# Patient Record
Sex: Male | Born: 1935 | Race: White | Hispanic: No | Marital: Married | Smoking: Smoker, current status unknown
Health system: Southern US, Community
[De-identification: ages and names within clinical notes are randomized; demographics above are authoritative.]

## PROBLEM LIST (undated history)

## (undated) DIAGNOSIS — Z960 Presence of urogenital implants: Secondary | ICD-10-CM

## (undated) DIAGNOSIS — I4891 Unspecified atrial fibrillation: Secondary | ICD-10-CM

## (undated) DIAGNOSIS — N390 Urinary tract infection, site not specified: Secondary | ICD-10-CM

## (undated) DIAGNOSIS — I10 Essential (primary) hypertension: Secondary | ICD-10-CM

## (undated) DIAGNOSIS — Z978 Presence of other specified devices: Secondary | ICD-10-CM

## (undated) DIAGNOSIS — M1990 Unspecified osteoarthritis, unspecified site: Secondary | ICD-10-CM

## (undated) DIAGNOSIS — F0390 Unspecified dementia without behavioral disturbance: Secondary | ICD-10-CM

## (undated) DIAGNOSIS — H269 Unspecified cataract: Secondary | ICD-10-CM

## (undated) HISTORY — PX: CHOLECYSTECTOMY: SHX55

---

## 2003-12-29 ENCOUNTER — Inpatient Hospital Stay: Payer: Self-pay | Admitting: Internal Medicine

## 2003-12-29 ENCOUNTER — Other Ambulatory Visit: Payer: Self-pay

## 2004-03-21 ENCOUNTER — Other Ambulatory Visit: Payer: Self-pay

## 2004-03-21 ENCOUNTER — Inpatient Hospital Stay: Payer: Self-pay | Admitting: Internal Medicine

## 2004-04-26 ENCOUNTER — Encounter: Payer: Self-pay | Admitting: Cardiology

## 2004-05-23 ENCOUNTER — Encounter: Payer: Self-pay | Admitting: Cardiology

## 2004-06-23 ENCOUNTER — Encounter: Payer: Self-pay | Admitting: Cardiology

## 2004-07-23 ENCOUNTER — Encounter: Payer: Self-pay | Admitting: Cardiology

## 2005-09-14 ENCOUNTER — Inpatient Hospital Stay: Payer: Self-pay | Admitting: Internal Medicine

## 2006-02-02 ENCOUNTER — Ambulatory Visit: Payer: Self-pay | Admitting: Cardiology

## 2006-05-05 ENCOUNTER — Inpatient Hospital Stay: Payer: Self-pay | Admitting: Internal Medicine

## 2006-05-05 ENCOUNTER — Other Ambulatory Visit: Payer: Self-pay

## 2006-06-19 ENCOUNTER — Ambulatory Visit: Payer: Self-pay | Admitting: Gastroenterology

## 2007-04-23 ENCOUNTER — Ambulatory Visit: Payer: Self-pay | Admitting: Internal Medicine

## 2007-07-23 ENCOUNTER — Ambulatory Visit: Payer: Self-pay | Admitting: Cardiology

## 2008-08-04 ENCOUNTER — Ambulatory Visit: Payer: Self-pay | Admitting: Ophthalmology

## 2008-08-10 ENCOUNTER — Ambulatory Visit: Payer: Self-pay | Admitting: Ophthalmology

## 2009-08-04 ENCOUNTER — Ambulatory Visit: Payer: Self-pay | Admitting: Ophthalmology

## 2009-08-16 ENCOUNTER — Ambulatory Visit: Payer: Self-pay | Admitting: Ophthalmology

## 2010-06-28 ENCOUNTER — Ambulatory Visit: Payer: Self-pay | Admitting: Gastroenterology

## 2011-10-23 ENCOUNTER — Ambulatory Visit: Payer: Self-pay | Admitting: Internal Medicine

## 2011-10-23 LAB — CREATININE, SERUM
Creatinine: 1.05 mg/dL (ref 0.60–1.30)
EGFR (Non-African Amer.): >60

## 2011-10-25 ENCOUNTER — Emergency Department: Payer: Self-pay | Admitting: Emergency Medicine

## 2011-10-25 LAB — URINALYSIS, COMPLETE
Bilirubin,UR: NEGATIVE
Glucose,UR: NEGATIVE mg/dL (ref 0–75)
Ketone: NEGATIVE
Leukocyte Esterase: NEGATIVE
Nitrite: NEGATIVE
Ph: 6 (ref 4.5–8.0)
Protein: NEGATIVE
RBC,UR: 80 /HPF (ref 0–5)
Specific Gravity: 1.017 (ref 1.003–1.030)

## 2011-10-25 LAB — CBC
HCT: 41.6 % (ref 40.0–52.0)
HGB: 14 g/dL (ref 13.0–18.0)
MCHC: 33.7 g/dL (ref 32.0–36.0)
MCV: 93 fL (ref 80–100)
Platelet: 129 10*3/uL — ABNORMAL LOW (ref 150–440)
RDW: 14.2 % (ref 11.5–14.5)
WBC: 11.4 10*3/uL — ABNORMAL HIGH (ref 3.8–10.6)

## 2011-10-25 LAB — COMPREHENSIVE METABOLIC PANEL
Alkaline Phosphatase: 78 U/L (ref 50–136)
Anion Gap: 8 (ref 7–16)
Calcium, Total: 8.2 mg/dL — ABNORMAL LOW (ref 8.5–10.1)
Chloride: 97 mmol/L — ABNORMAL LOW (ref 98–107)
Co2: 28 mmol/L (ref 21–32)
EGFR (African American): 29 — ABNORMAL LOW
EGFR (Non-African Amer.): 25 — ABNORMAL LOW
Osmolality: 271 (ref 275–301)
SGPT (ALT): 24 U/L (ref 12–78)
Sodium: 133 mmol/L — ABNORMAL LOW (ref 136–145)
Total Protein: 6.4 g/dL (ref 6.4–8.2)

## 2011-10-25 LAB — LIPASE, BLOOD: Lipase: 92 U/L (ref 73–393)

## 2011-10-27 ENCOUNTER — Emergency Department: Payer: Self-pay | Admitting: Emergency Medicine

## 2011-10-27 LAB — COMPREHENSIVE METABOLIC PANEL
Alkaline Phosphatase: 80 U/L (ref 50–136)
BUN: 18 mg/dL (ref 7–18)
Bilirubin,Total: 0.5 mg/dL (ref 0.2–1.0)
Chloride: 105 mmol/L (ref 98–107)
Co2: 29 mmol/L (ref 21–32)
Creatinine: 1.19 mg/dL (ref 0.60–1.30)
EGFR (Non-African Amer.): 53 — ABNORMAL LOW
Glucose: 89 mg/dL (ref 65–99)
Osmolality: 284 (ref 275–301)
SGOT(AST): 34 U/L (ref 15–37)
SGPT (ALT): 28 U/L (ref 12–78)
Sodium: 142 mmol/L (ref 136–145)
Total Protein: 6.3 g/dL — ABNORMAL LOW (ref 6.4–8.2)

## 2011-10-27 LAB — CBC
HCT: 39.8 % — ABNORMAL LOW (ref 40.0–52.0)
HGB: 13.6 g/dL (ref 13.0–18.0)
MCH: 31.9 pg (ref 26.0–34.0)
MCHC: 34.2 g/dL (ref 32.0–36.0)
MCV: 94 fL (ref 80–100)
RBC: 4.26 10*6/uL — ABNORMAL LOW (ref 4.40–5.90)

## 2011-10-27 LAB — URINALYSIS, COMPLETE
Bilirubin,UR: NEGATIVE
Glucose,UR: NEGATIVE mg/dL (ref 0–75)
Ketone: NEGATIVE
Leukocyte Esterase: NEGATIVE
Nitrite: NEGATIVE
Ph: 7 (ref 4.5–8.0)
RBC,UR: 25030 /HPF (ref 0–5)
Squamous Epithelial: NONE SEEN
WBC UR: 124 /HPF (ref 0–5)

## 2011-10-27 LAB — PROTIME-INR: INR: 2.8

## 2011-10-27 LAB — APTT: Activated PTT: 43.9 secs — ABNORMAL HIGH (ref 23.6–35.9)

## 2013-06-15 ENCOUNTER — Emergency Department: Payer: Self-pay | Admitting: Emergency Medicine

## 2013-06-15 LAB — CBC WITH DIFFERENTIAL/PLATELET
BASOS PCT: 0.7 %
Basophil #: 0 10*3/uL (ref 0.0–0.1)
EOS PCT: 1.3 %
Eosinophil #: 0.1 10*3/uL (ref 0.0–0.7)
HCT: 42 % (ref 40.0–52.0)
HGB: 13.8 g/dL (ref 13.0–18.0)
LYMPHS PCT: 23.8 %
Lymphocyte #: 1.1 10*3/uL (ref 1.0–3.6)
MCH: 30.8 pg (ref 26.0–34.0)
MCHC: 32.9 g/dL (ref 32.0–36.0)
MCV: 94 fL (ref 80–100)
MONO ABS: 0.5 x10 3/mm (ref 0.2–1.0)
Monocyte %: 9.5 %
NEUTROS PCT: 64.7 %
Neutrophil #: 3.1 10*3/uL (ref 1.4–6.5)
Platelet: 120 10*3/uL — ABNORMAL LOW (ref 150–440)
RBC: 4.48 10*6/uL (ref 4.40–5.90)
RDW: 15.6 % — AB (ref 11.5–14.5)
WBC: 4.7 10*3/uL (ref 3.8–10.6)

## 2013-06-15 LAB — TSH: THYROID STIMULATING HORM: 2.11 u[IU]/mL

## 2013-06-15 LAB — COMPREHENSIVE METABOLIC PANEL
ALK PHOS: 70 U/L
Albumin: 3.2 g/dL — ABNORMAL LOW (ref 3.4–5.0)
Anion Gap: 6 — ABNORMAL LOW (ref 7–16)
BUN: 18 mg/dL (ref 7–18)
Bilirubin,Total: 0.9 mg/dL (ref 0.2–1.0)
CALCIUM: 8.3 mg/dL — AB (ref 8.5–10.1)
Chloride: 105 mmol/L (ref 98–107)
Co2: 29 mmol/L (ref 21–32)
Creatinine: 0.96 mg/dL (ref 0.60–1.30)
GLUCOSE: 99 mg/dL (ref 65–99)
Osmolality: 281 (ref 275–301)
Potassium: 3.9 mmol/L (ref 3.5–5.1)
SGOT(AST): 18 U/L (ref 15–37)
SGPT (ALT): 17 U/L (ref 12–78)
SODIUM: 140 mmol/L (ref 136–145)
Total Protein: 6 g/dL — ABNORMAL LOW (ref 6.4–8.2)

## 2013-06-15 LAB — URINALYSIS, COMPLETE
Bilirubin,UR: NEGATIVE
Glucose,UR: NEGATIVE mg/dL (ref 0–75)
Ketone: NEGATIVE
Nitrite: NEGATIVE
PH: 5 (ref 4.5–8.0)
RBC,UR: 58 /HPF (ref 0–5)
Specific Gravity: 1.023 (ref 1.003–1.030)
Squamous Epithelial: NONE SEEN
WBC UR: 1758 /HPF (ref 0–5)

## 2013-06-15 LAB — PROTIME-INR
INR: 1.7
PROTHROMBIN TIME: 19.6 s — AB (ref 11.5–14.7)

## 2013-06-15 LAB — LIPASE, BLOOD: Lipase: 100 U/L (ref 73–393)

## 2013-06-15 LAB — DIGOXIN LEVEL: Digoxin: 1.13 ng/mL

## 2013-06-15 LAB — TROPONIN I: Troponin-I: <0.02 ng/mL

## 2013-06-18 LAB — URINE CULTURE

## 2013-06-20 LAB — CULTURE, BLOOD (SINGLE)

## 2013-06-30 ENCOUNTER — Ambulatory Visit: Payer: Self-pay | Admitting: Internal Medicine

## 2014-01-24 ENCOUNTER — Emergency Department: Payer: Self-pay | Admitting: Emergency Medicine

## 2014-02-14 ENCOUNTER — Emergency Department: Payer: Self-pay | Admitting: Emergency Medicine

## 2014-02-14 LAB — COMPREHENSIVE METABOLIC PANEL
ALBUMIN: 3.2 g/dL — AB (ref 3.4–5.0)
ALT: 19 U/L
Alkaline Phosphatase: 72 U/L
Anion Gap: 7 (ref 7–16)
BUN: 12 mg/dL (ref 7–18)
Bilirubin,Total: 0.8 mg/dL (ref 0.2–1.0)
Calcium, Total: 8.4 mg/dL — ABNORMAL LOW (ref 8.5–10.1)
Chloride: 105 mmol/L (ref 98–107)
Co2: 29 mmol/L (ref 21–32)
Creatinine: 0.88 mg/dL (ref 0.60–1.30)
EGFR (African American): >60
EGFR (Non-African Amer.): >60
Glucose: 89 mg/dL (ref 65–99)
Osmolality: 280 (ref 275–301)
POTASSIUM: 4.3 mmol/L (ref 3.5–5.1)
SGOT(AST): 29 U/L (ref 15–37)
SODIUM: 141 mmol/L (ref 136–145)
Total Protein: 6.5 g/dL (ref 6.4–8.2)

## 2014-02-14 LAB — CBC
HCT: 45.6 % (ref 40.0–52.0)
HGB: 14.6 g/dL (ref 13.0–18.0)
MCH: 30.6 pg (ref 26.0–34.0)
MCHC: 32.1 g/dL (ref 32.0–36.0)
MCV: 95 fL (ref 80–100)
Platelet: 128 10*3/uL — ABNORMAL LOW (ref 150–440)
RBC: 4.78 10*6/uL (ref 4.40–5.90)
RDW: 14.7 % — AB (ref 11.5–14.5)
WBC: 4.6 10*3/uL (ref 3.8–10.6)

## 2014-02-14 LAB — URINALYSIS, COMPLETE
Bilirubin,UR: NEGATIVE
Glucose,UR: NEGATIVE mg/dL (ref 0–75)
KETONE: NEGATIVE
Nitrite: NEGATIVE
PH: 6 (ref 4.5–8.0)
PROTEIN: NEGATIVE
RBC,UR: 2 /HPF (ref 0–5)
Specific Gravity: 1.005 (ref 1.003–1.030)
Squamous Epithelial: NONE SEEN
WBC UR: 14 /HPF (ref 0–5)

## 2014-02-14 LAB — TROPONIN I: Troponin-I: <0.02 ng/mL

## 2014-02-14 LAB — PROTIME-INR
INR: 1.6
PROTHROMBIN TIME: 18.8 s — AB (ref 11.5–14.7)

## 2014-02-14 LAB — DIGOXIN LEVEL: Digoxin: 1.55 ng/mL

## 2014-02-15 ENCOUNTER — Emergency Department: Payer: Self-pay | Admitting: Emergency Medicine

## 2014-05-02 ENCOUNTER — Emergency Department
Admit: 2014-05-02 | Disposition: A | Discharge status: Home / Self Care | Payer: Self-pay | Admitting: Emergency Medicine

## 2014-05-02 LAB — COMPREHENSIVE METABOLIC PANEL
ALK PHOS: 57 U/L
ANION GAP: 3 — AB (ref 7–16)
Albumin: 3.2 g/dL — ABNORMAL LOW
BILIRUBIN TOTAL: 0.8 mg/dL
BUN: 18 mg/dL
CHLORIDE: 107 mmol/L
CO2: 31 mmol/L
CREATININE: 0.99 mg/dL
Calcium, Total: 8.5 mg/dL — ABNORMAL LOW
Glucose: 100 mg/dL — ABNORMAL HIGH
POTASSIUM: 4.1 mmol/L
SGOT(AST): 18 U/L
SGPT (ALT): 15 U/L — ABNORMAL LOW
SODIUM: 141 mmol/L
Total Protein: 5.8 g/dL — ABNORMAL LOW

## 2014-05-02 LAB — URINALYSIS, COMPLETE
Bilirubin,UR: NEGATIVE
GLUCOSE, UR: NEGATIVE mg/dL (ref 0–75)
Ketone: NEGATIVE
Nitrite: NEGATIVE
Ph: 7 (ref 4.5–8.0)
Protein: NEGATIVE
SPECIFIC GRAVITY: 1.005 (ref 1.003–1.030)

## 2014-05-02 LAB — CBC
HCT: 38 % — ABNORMAL LOW (ref 40.0–52.0)
HGB: 12.3 g/dL — AB (ref 13.0–18.0)
MCH: 30.4 pg (ref 26.0–34.0)
MCHC: 32.5 g/dL (ref 32.0–36.0)
MCV: 94 fL (ref 80–100)
Platelet: 108 10*3/uL — ABNORMAL LOW (ref 150–440)
RBC: 4.05 10*6/uL — ABNORMAL LOW (ref 4.40–5.90)
RDW: 15.2 % — ABNORMAL HIGH (ref 11.5–14.5)
WBC: 4.7 10*3/uL (ref 3.8–10.6)

## 2014-05-02 LAB — DRUG SCREEN, URINE
Amphetamines, Ur Screen: NEGATIVE
BENZODIAZEPINE, UR SCRN: NEGATIVE
Barbiturates, Ur Screen: NEGATIVE
COCAINE METABOLITE, UR ~~LOC~~: NEGATIVE
Cannabinoid 50 Ng, Ur ~~LOC~~: NEGATIVE
MDMA (Ecstasy)Ur Screen: NEGATIVE
METHADONE, UR SCREEN: NEGATIVE
Opiate, Ur Screen: NEGATIVE
PHENCYCLIDINE (PCP) UR S: NEGATIVE
Tricyclic, Ur Screen: NEGATIVE

## 2014-05-02 LAB — SALICYLATE LEVEL

## 2014-05-02 LAB — ACETAMINOPHEN LEVEL

## 2014-05-02 LAB — TSH: Thyroid Stimulating Horm: 1.874 u[IU]/mL

## 2014-05-02 LAB — ETHANOL: Ethanol: <5 mg/dL

## 2014-05-05 LAB — URINE CULTURE

## 2014-05-16 ENCOUNTER — Emergency Department: Admit: 2014-05-16 | Disposition: A | Discharge status: Home / Self Care | Payer: Self-pay | Admitting: Student

## 2014-05-16 LAB — COMPREHENSIVE METABOLIC PANEL WITH GFR
Albumin: 3.9 g/dL
Alkaline Phosphatase: 70 U/L
Anion Gap: 9
BUN: 20 mg/dL
Bilirubin,Total: 1.5 mg/dL — ABNORMAL HIGH
Calcium, Total: 8.9 mg/dL
Chloride: 104 mmol/L
Co2: 29 mmol/L
Creatinine: 1.18 mg/dL
EGFR (African American): >60
EGFR (Non-African Amer.): 53 — ABNORMAL LOW
Glucose: 125 mg/dL — ABNORMAL HIGH
Potassium: 4.1 mmol/L
SGOT(AST): 27 U/L
SGPT (ALT): 17 U/L
Sodium: 142 mmol/L
Total Protein: 6.5 g/dL

## 2014-05-16 LAB — PROTIME-INR
INR: 1.8
Prothrombin Time: 21.3 secs — ABNORMAL HIGH

## 2014-05-16 LAB — URINALYSIS, COMPLETE: Squamous Epithelial: NONE SEEN

## 2014-05-16 LAB — CBC
HCT: 41.4 %
HGB: 13.4 g/dL
MCH: 30.2 pg
MCHC: 32.4 g/dL
MCV: 93 fL
Platelet: 118 x10 3/mm 3 — ABNORMAL LOW
RBC: 4.44 x10 6/mm 3
RDW: 15.4 % — ABNORMAL HIGH
WBC: 10.1 x10 3/mm 3

## 2014-05-16 LAB — APTT: Activated PTT: 34.8 s

## 2014-05-16 NOTE — Consult Note (Signed)
PATIENT NAME:  Gabriel Valencia, Gabriel Valencia MR#:  [id number] DATE OF BIRTH:  1919-09-14  DATE OF CONSULTATION:  06/15/2013  CONSULTING PHYSICIAN:  Herschell Dimesichard J. Renae GlossWieting, MD  PRIMARY CARE PHYSICIAN: Dr. Judithann SheenSparks.  EMERGENCY ROOM PHYSICIAN: Dr. Darnelle CatalanMalinda.  CHIEF COMPLAINT: Brought in secondary to confusion.   HISTORY OF PRESENT ILLNESS: This is a 81 year old man with history of dementia, coronary artery disease, atrial fibrillation, chronic Foley. Family brought him in today secondary to confusion. Yesterday he was very confused. He sometimes gets lost in his house. He had a bad night last night. He needs help getting dressed. [place] the church [place] he was really confused and had an episode where he was unresponsive for a period of time. They could not get him in the car. Yesterday he did complain of a headache. In the ER, as per family the patient has been doing better and seems better than what he was as long as he is cooperative, they are able to take him home. They were concerned about a stroke and brought him in. In the ER, a urinalysis was positive but that is secondary to a chronic catheter. I will not treat that.   PAST MEDICAL HISTORY: Coronary artery disease, atrial fibrillation, chronic Foley, dementia, hyperlipidemia.   PAST SURGICAL HISTORY: Cholecystectomy, ERCPs removing gallstones, hernia, numerous facial surgeries in the past.   ALLERGIES: No known drug allergies.   MEDICATIONS: Include digoxin 0.125 mg daily, Imdur 120 mg daily, warfarin 3 mg at bedtime, metoprolol 100 mg b.i.d., Toviaz ER 4 mg daily, aspirin 81 mg daily, losartan 100 mg daily, omeprazole 20 mg daily, levothyroxine 50 mcg daily, Zocor 40 mg at bedtime, Equate allergy as needed, Xanax p.r.n.   SOCIAL HISTORY: No smoking. No alcohol. No drug use. Lives with family, worked as a Optician, dispensingminister.   FAMILY HISTORY: Father died in his 8360s of an MI. Mother died in her 5260s of some sort of palsy.   REVIEW OF SYSTEMS:  GENERAL: No fever.  HEENT:  Eyes: Wears glasses. Ears, nose, mouth, and throat: Decreased hearing.  CARDIOVASCULAR: Occasional chest pain.  RESPIRATORY: Occasional shortness of breath.  GASTROINTESTINAL: No nausea. No vomiting. No abdominal pain. No diarrhea.  GENITOURINARY: Has a chronic catheter.  MUSCULOSKELETAL: No joint pain.  INTEGUMENT: No rashes.  NEUROLOGICAL: No fainting, but had an unresponsive episode.  PSYCHIATRIC: No anxiety or depression.  ENDOCRINE: No thyroid problems.  HEMATOLOGIC AND LYMPHATIC: No anemia.   PHYSICAL EXAMINATION:  VITAL SIGNS: Temperature 97.5, pulse 59, respirations 18, blood pressure 154/85, pulse oximetry 97% on room air.  GENERAL: No respiratory distress.  HEENT: Eyes: Conjunctivae and lids normal. Pupils equal, round, and reactive to light. Extraocular muscles intact. No nystagmus. Ears, nose, mouth and throat: Tympanic membranes: No erythema. Nasal mucosa: No erythema. Throat: No erythema. No exudate. Lips and gums: No lesions.  NECK: No JVD. No bruits. No lymphadenopathy. No thyromegaly. No thyroid nodules palpated.  LUNGS: Clear to auscultation. No use of accessory muscles to breathe. No rhonchi, rales, or wheeze heard.  CARDIOVASCULAR: S1, S2 normal. No gallops, rubs, or murmurs heard. Carotid upstroke 2+ bilaterally. Dorsalis pedal pulses 1+ bilaterally.  ABDOMEN: Soft, nontender. No organomegaly/splenomegaly. Normoactive bowel sounds. No masses felt.  LYMPHATIC: No lymph nodes in the neck.  MUSCULOSKELETAL: No clubbing, edema, or cyanosis.  SKIN: No ulcers or lesions.  NEUROLOGIC: Cranial nerves II-XII grossly intact. Deep tendon reflexes 1+ bilateral lower extremity. Power 5/5 upper and lower extremities. Babinski negative.  PSYCHIATRIC: The patient is alert to person.  LABORATORY AND RADIOLOGICAL DATA: Urinalysis: 3+ leukocyte esterase, 2+ bacteria, white blood cell count 4.7, H and H 13.8 and 42.0, platelet count of 120,000, glucose 99, BUN 18, creatinine 0.96, sodium  140, potassium 3.9, chloride 105, CO2 of 29, calcium 8.3. Liver function tests: Albumin low at 3.2, total protein 6.0. Other liver function tests normal range. INR 1.7. Troponin negative. TSH 2.11, digoxin 1.13.   CHEST X-RAY: Cardiomegaly, no infiltrates. CT scan of the head showed atrophy, mild chronic microvascular ischemic change. No acute abnormality.   ASSESSMENT AND PLAN:  1. Acute encephalopathy. I think this has improved. Unclear what happened during the day, but the patient does have dementia. Could be worsening of the dementia status. The patient also had a poor night's sleep, which could be contributing to the patient's confusion today. The patient is back to baseline. Family does not want to have placement as they are willing to take him home. They were just concerned it may be a stroke. His CAT scan of the head was negative. His strength is good. I did offer an MRI if they were still concerned but he is already on Coumadin and aspirin and there is nothing further that I would do for him. Family agreed to take the patient home tonight.  2. Dementia. I would recommend following up with Dr. Judithann Sheen. Can consider medications for sleep if sleep continues to be an issue  3. Chronic atrial fibrillation and coronary artery disease. Must weigh the benefits of risks with Coumadin treatment. INR is 1.7. If this gentleman falls and hits his head and bleeds into the brain, he could die right there but the risk of stroke is definitely there also. He is rate controlled with metoprolol and digoxin.  4. Hypothyroidism. Continue levothyroxine.  5. Hyperlipidemia. Continue Zocor.  6. Gastroesophageal reflux disease. Continue omeprazole. 7. Positive urinalysis in a patient that has a chronic Foley catheter. This does not get treated, I repeat, this does not get treated. I had the ER nurse change his Foley catheter in the Emergency Room. The patient has a normal white count. No fever. Normal blood pressure. Again  this is a colonization regardless of what the urine culture shows. This does not get treated   TIME SPENT ON EMERGENCY ROOM CONSULTATION AND DISCHARGE: 60 minutes.    ____________________________ Herschell Dimes. Renae Gloss, MD rjw:lt D: 06/15/2013 19:47:19 ET T: 06/15/2013 20:45:42 ET JOB#: [id number]  cc: Herschell Dimes. Renae Gloss, MD, <Dictator> Duane Lope. Judithann Sheen, MD Salley Scarlet MD ELECTRONICALLY SIGNED 06/19/2013 14:23

## 2014-05-18 ENCOUNTER — Emergency Department
Admit: 2014-05-18 | Disposition: A | Discharge status: Home / Self Care | Payer: Self-pay | Admitting: Emergency Medicine

## 2014-05-18 LAB — COMPREHENSIVE METABOLIC PANEL
AST: 47 U/L — AB
Albumin: 3.1 g/dL — ABNORMAL LOW
Alkaline Phosphatase: 58 U/L
Anion Gap: 3 — ABNORMAL LOW (ref 7–16)
BUN: 29 mg/dL — AB
Bilirubin,Total: 1.4 mg/dL — ABNORMAL HIGH
CHLORIDE: 104 mmol/L
Calcium, Total: 8.2 mg/dL — ABNORMAL LOW
Co2: 32 mmol/L
Creatinine: 0.96 mg/dL
EGFR (Non-African Amer.): >60
GLUCOSE: 111 mg/dL — AB
Potassium: 4.9 mmol/L
SGPT (ALT): 15 U/L — ABNORMAL LOW
SODIUM: 139 mmol/L
TOTAL PROTEIN: 5.5 g/dL — AB

## 2014-05-18 LAB — CBC WITH DIFFERENTIAL/PLATELET
Basophil #: 0 10*3/uL (ref 0.0–0.1)
Basophil %: 0.3 %
EOS PCT: 1.4 %
Eosinophil #: 0.1 10*3/uL (ref 0.0–0.7)
HCT: 36.9 % — ABNORMAL LOW (ref 40.0–52.0)
HGB: 12.3 g/dL — ABNORMAL LOW (ref 13.0–18.0)
LYMPHS ABS: 0.9 10*3/uL — AB (ref 1.0–3.6)
Lymphocyte %: 15.4 %
MCH: 31.1 pg (ref 26.0–34.0)
MCHC: 33.4 g/dL (ref 32.0–36.0)
MCV: 93 fL (ref 80–100)
MONOS PCT: 9.3 %
Monocyte #: 0.5 x10 3/mm (ref 0.2–1.0)
Neutrophil #: 4.1 10*3/uL (ref 1.4–6.5)
Neutrophil %: 73.6 %
Platelet: 106 10*3/uL — ABNORMAL LOW (ref 150–440)
RBC: 3.96 10*6/uL — AB (ref 4.40–5.90)
RDW: 15 % — ABNORMAL HIGH (ref 11.5–14.5)
WBC: 5.6 10*3/uL (ref 3.8–10.6)

## 2014-05-18 LAB — URINALYSIS, COMPLETE
Bilirubin,UR: NEGATIVE
Glucose,UR: NEGATIVE mg/dL (ref 0–75)
Nitrite: NEGATIVE
PH: 5 (ref 4.5–8.0)
Protein: 100
Specific Gravity: 1.02 (ref 1.003–1.030)

## 2014-05-18 LAB — PROTIME-INR
INR: 2.4
Prothrombin Time: 25.9 secs — ABNORMAL HIGH

## 2014-05-18 LAB — DIGOXIN LEVEL: Digoxin: 1 ng/mL

## 2014-05-18 LAB — URINE CULTURE

## 2014-05-23 LAB — URINE CULTURE

## 2014-05-24 NOTE — Consult Note (Signed)
PATIENT NAME:  Gabriel PerchesMCBRIDE, [name] MR#:  [id number] DATE OF BIRTH:  [birth year]  DATE OF CONSULTATION:  05/03/2014  CONSULTING PHYSICIAN:  Anes Rigel K. Dyquan Minks, MD.  ROOM:  Emergency Room bed 23A.    AGE:  81 years.    SEX:  Male.    RACE:  White.    SUBJECTIVE:  Patient was [facility] in [facility] Emergency Room, bed 23A.  Patient is a 81 year old white male who is retired and [facility].   Patient was agitated with aggressive behavior last night which is not his type of pattern and he was brought here for help.  According to information obtained, patient had a UTI which was diagnosed and is being treated for the same.  Patient is a poor historian and he is hard of hearing, and he reported that he "does not have hearing aids which makes it difficult to hear and understand."    OBJECTIVE:  Patient was seen lying in bed calm and pleasant.  No agitation.  He knew his name and he knew his age.  He stated that he was in a nursing home.  He could not state the year or date, and he is hard of hearing.  However, he was cheerful and smiling.  He looked at the Child psychotherapistsocial worker and stated, "That is a lady who takes care."  Does not appear to be responding to internal stimuli.  Does not appear to be having any ideas or plan to hurt himself or others. Insight and judgment is fair and adequate Impulse control is fair.   IMPRESSION:  Dementia with behavioral disturbances and probably secondary to urinary tract infection which is being treated and hopefully this will be resolved.    RECOMMENDATIONS:  Continue the treatment for UTI and after this under control, probably his behavior of aggression will be stabilized.   Discharge patient back to the same living facility, and he has an appointment coming up on Thursday, 05/07/2014, with his PCP, Dr. Judithann SheenSparks, and he has to keep the same.      ____________________________ Jannet MantisSurya K. Guss Bundehalla, MD skc:kc D: 05/03/2014 14:26:00  ET T: 05/03/2014 16:06:22 ET JOB#: [id number]  cc: Monika SalkSurya K. Guss Bundehalla, MD, <Dictator> Beau FannySURYA K Dawnisha Marquina MD ELECTRONICALLY SIGNED 05/09/2014 10:46

## 2014-05-25 ENCOUNTER — Observation Stay
Admission: EM | Admit: 2014-05-25 | Discharge: 2014-05-27 | Disposition: A | Discharge status: Skilled Nursing Facility | Payer: PPO | Attending: Internal Medicine | Admitting: Internal Medicine

## 2014-05-25 ENCOUNTER — Encounter: Payer: Self-pay | Admitting: Emergency Medicine

## 2014-05-25 DIAGNOSIS — M1990 Unspecified osteoarthritis, unspecified site: Secondary | ICD-10-CM | POA: Insufficient documentation

## 2014-05-25 DIAGNOSIS — R339 Retention of urine, unspecified: Secondary | ICD-10-CM | POA: Insufficient documentation

## 2014-05-25 DIAGNOSIS — Z8249 Family history of ischemic heart disease and other diseases of the circulatory system: Secondary | ICD-10-CM | POA: Diagnosis not present

## 2014-05-25 DIAGNOSIS — N401 Enlarged prostate with lower urinary tract symptoms: Secondary | ICD-10-CM | POA: Diagnosis not present

## 2014-05-25 DIAGNOSIS — N3001 Acute cystitis with hematuria: Principal | ICD-10-CM | POA: Insufficient documentation

## 2014-05-25 DIAGNOSIS — Z7901 Long term (current) use of anticoagulants: Secondary | ICD-10-CM | POA: Diagnosis not present

## 2014-05-25 DIAGNOSIS — R319 Hematuria, unspecified: Secondary | ICD-10-CM | POA: Diagnosis present

## 2014-05-25 DIAGNOSIS — N390 Urinary tract infection, site not specified: Secondary | ICD-10-CM | POA: Diagnosis not present

## 2014-05-25 DIAGNOSIS — I10 Essential (primary) hypertension: Secondary | ICD-10-CM | POA: Diagnosis not present

## 2014-05-25 DIAGNOSIS — E43 Unspecified severe protein-calorie malnutrition: Secondary | ICD-10-CM | POA: Diagnosis not present

## 2014-05-25 DIAGNOSIS — Z7982 Long term (current) use of aspirin: Secondary | ICD-10-CM | POA: Insufficient documentation

## 2014-05-25 DIAGNOSIS — D649 Anemia, unspecified: Secondary | ICD-10-CM | POA: Insufficient documentation

## 2014-05-25 DIAGNOSIS — E039 Hypothyroidism, unspecified: Secondary | ICD-10-CM | POA: Insufficient documentation

## 2014-05-25 DIAGNOSIS — I482 Chronic atrial fibrillation: Secondary | ICD-10-CM | POA: Insufficient documentation

## 2014-05-25 DIAGNOSIS — F0390 Unspecified dementia without behavioral disturbance: Secondary | ICD-10-CM | POA: Insufficient documentation

## 2014-05-25 DIAGNOSIS — H269 Unspecified cataract: Secondary | ICD-10-CM | POA: Insufficient documentation

## 2014-05-25 DIAGNOSIS — R109 Unspecified abdominal pain: Secondary | ICD-10-CM | POA: Insufficient documentation

## 2014-05-25 HISTORY — DX: Unspecified dementia, unspecified severity, without behavioral disturbance, psychotic disturbance, mood disturbance, and anxiety: F03.90

## 2014-05-25 HISTORY — DX: Essential (primary) hypertension: I10

## 2014-05-25 HISTORY — DX: Unspecified atrial fibrillation: I48.91

## 2014-05-25 HISTORY — DX: Unspecified cataract: H26.9

## 2014-05-25 HISTORY — DX: Unspecified osteoarthritis, unspecified site: M19.90

## 2014-05-25 LAB — CBC
HEMATOCRIT: 37.7 % — AB (ref 40.0–52.0)
Hemoglobin: 12.4 g/dL — ABNORMAL LOW (ref 13.0–18.0)
MCH: 30.7 pg (ref 26.0–34.0)
MCHC: 33 g/dL (ref 32.0–36.0)
MCV: 93.1 fL (ref 80.0–100.0)
Platelets: 130 10*3/uL — ABNORMAL LOW (ref 150–440)
RBC: 4.05 MIL/uL — ABNORMAL LOW (ref 4.40–5.90)
RDW: 15 % — AB (ref 11.5–14.5)
WBC: 5.4 10*3/uL (ref 3.8–10.6)

## 2014-05-25 LAB — PROTIME-INR
INR: 2.01
Prothrombin Time: 22.9 seconds — ABNORMAL HIGH (ref 11.4–15.0)

## 2014-05-25 LAB — GLUCOSE, CAPILLARY: GLUCOSE-CAPILLARY: 115 mg/dL — AB (ref 70–99)

## 2014-05-25 MED ORDER — MORPHINE SULFATE 2 MG/ML IJ SOLN
2.0000 mg | Freq: Once | INTRAMUSCULAR | Status: AC
Start: 2014-05-25 — End: 2014-05-25
  Administered 2014-05-25: 2 mg via INTRAVENOUS

## 2014-05-25 MED ORDER — ACETAMINOPHEN 325 MG PO TABS: 650.0000 mg | ORAL_TABLET | Freq: Four times a day (QID) | ORAL | Status: DC | PRN

## 2014-05-25 MED ORDER — ACETAMINOPHEN 650 MG RE SUPP: 650.0000 mg | Freq: Four times a day (QID) | RECTAL | Status: DC | PRN

## 2014-05-25 MED ORDER — MAGNESIUM HYDROXIDE 400 MG/5ML PO SUSP
30.0000 mL | Freq: Every day | ORAL | Status: DC | PRN
  Administered 2014-05-26: 30 mL via ORAL
  Filled 2014-05-25: qty 30

## 2014-05-25 MED ORDER — HALOPERIDOL 5 MG PO TABS
5.0000 mg | ORAL_TABLET | Freq: Three times a day (TID) | ORAL | Status: DC | PRN
  Administered 2014-05-26 (×2): 5 mg via ORAL
  Filled 2014-05-25 (×7): qty 1

## 2014-05-25 MED ORDER — SODIUM CHLORIDE 0.9 % IV SOLN
INTRAVENOUS | Status: DC
  Administered 2014-05-25: 19:00:00 via INTRAVENOUS

## 2014-05-25 MED ORDER — ISOSORBIDE MONONITRATE ER 60 MG PO TB24
120.0000 mg | ORAL_TABLET | Freq: Every day | ORAL | Status: DC
  Administered 2014-05-25: 19:00:00 120 mg via ORAL
  Filled 2014-05-25: qty 2

## 2014-05-25 MED ORDER — ONDANSETRON HCL 4 MG PO TABS: 4.0000 mg | ORAL_TABLET | Freq: Four times a day (QID) | ORAL | Status: DC | PRN

## 2014-05-25 MED ORDER — DIGOXIN 125 MCG PO TABS
0.1250 mg | ORAL_TABLET | Freq: Every day | ORAL | Status: DC
  Administered 2014-05-25 – 2014-05-27 (×3): 0.125 mg via ORAL
  Filled 2014-05-25 (×3): qty 1

## 2014-05-25 MED ORDER — TRAMADOL HCL 50 MG PO TABS: 50.0000 mg | ORAL_TABLET | ORAL | Status: DC | PRN

## 2014-05-25 MED ORDER — METOPROLOL TARTRATE 100 MG PO TABS
100.0000 mg | ORAL_TABLET | Freq: Two times a day (BID) | ORAL | Status: DC
  Administered 2014-05-25: 100 mg via ORAL
  Filled 2014-05-25: qty 1

## 2014-05-25 MED ORDER — OXYBUTYNIN CHLORIDE 5 MG PO TABS
5.0000 mg | ORAL_TABLET | Freq: Three times a day (TID) | ORAL | Status: DC
  Administered 2014-05-25 – 2014-05-27 (×5): 5 mg via ORAL
  Filled 2014-05-25 (×5): qty 1

## 2014-05-25 MED ORDER — MORPHINE SULFATE 2 MG/ML IJ SOLN
INTRAMUSCULAR | Status: AC
  Filled 2014-05-25: qty 1

## 2014-05-25 MED ORDER — LORATADINE 10 MG PO TABS
10.0000 mg | ORAL_TABLET | Freq: Every day | ORAL | Status: DC
Start: 2014-05-25 — End: 2014-05-27
  Administered 2014-05-25 – 2014-05-27 (×3): 10 mg via ORAL
  Filled 2014-05-25 (×3): qty 1

## 2014-05-25 MED ORDER — ONDANSETRON HCL 4 MG/2ML IJ SOLN: 4.0000 mg | Freq: Four times a day (QID) | INTRAMUSCULAR | Status: DC | PRN

## 2014-05-25 MED ORDER — QUETIAPINE FUMARATE 25 MG PO TABS
25.0000 mg | ORAL_TABLET | ORAL | Status: DC
  Administered 2014-05-26 – 2014-05-27 (×2): 25 mg via ORAL
  Filled 2014-05-25: qty 1

## 2014-05-25 MED ORDER — SODIUM CHLORIDE 0.9 % IV BOLUS (SEPSIS)
1000.0000 mL | Freq: Once | INTRAVENOUS | Status: AC
  Administered 2014-05-26: 20:00:00 1000 mL via INTRAVENOUS

## 2014-05-25 MED ORDER — BISACODYL 5 MG PO TBEC: 5.0000 mg | DELAYED_RELEASE_TABLET | Freq: Every day | ORAL | Status: DC | PRN

## 2014-05-25 MED ORDER — SIMVASTATIN 40 MG PO TABS
40.0000 mg | ORAL_TABLET | Freq: Every day | ORAL | Status: DC
  Administered 2014-05-25 – 2014-05-26 (×2): 40 mg via ORAL
  Filled 2014-05-25 (×2): qty 1

## 2014-05-25 MED ORDER — NITROGLYCERIN 0.4 MG SL SUBL: 0.4000 mg | SUBLINGUAL_TABLET | SUBLINGUAL | Status: DC | PRN

## 2014-05-25 MED ORDER — PANTOPRAZOLE SODIUM 40 MG PO TBEC
40.0000 mg | DELAYED_RELEASE_TABLET | Freq: Every day | ORAL | Status: DC
Start: 2014-05-25 — End: 2014-05-27
  Administered 2014-05-25 – 2014-05-27 (×3): 40 mg via ORAL
  Filled 2014-05-25 (×3): qty 1

## 2014-05-25 MED ORDER — LOSARTAN POTASSIUM 50 MG PO TABS
100.0000 mg | ORAL_TABLET | Freq: Every day | ORAL | Status: DC
  Administered 2014-05-25: 100 mg via ORAL
  Filled 2014-05-25: qty 2

## 2014-05-25 MED ORDER — LEVOTHYROXINE SODIUM 50 MCG PO TABS
50.0000 ug | ORAL_TABLET | Freq: Every day | ORAL | Status: DC
  Administered 2014-05-26 – 2014-05-27 (×2): 50 ug via ORAL
  Filled 2014-05-25 (×2): qty 1

## 2014-05-25 MED ORDER — CEPHALEXIN 500 MG PO CAPS
500.0000 mg | ORAL_CAPSULE | Freq: Three times a day (TID) | ORAL | Status: DC
  Administered 2014-05-25 – 2014-05-27 (×6): 500 mg via ORAL
  Filled 2014-05-25 (×6): qty 1

## 2014-05-25 MED ORDER — QUETIAPINE FUMARATE 25 MG PO TABS
50.0000 mg | ORAL_TABLET | Freq: Every day | ORAL | Status: DC
  Administered 2014-05-25 – 2014-05-26 (×2): 50 mg via ORAL
  Filled 2014-05-25 (×3): qty 2

## 2014-05-25 MED ORDER — LEVOTHYROXINE SODIUM 50 MCG PO TABS: 50.0000 ug | ORAL_TABLET | Freq: Every day | ORAL | Status: DC

## 2014-05-25 NOTE — ED Notes (Signed)
[facility] c/o hematuria.  Per EMS pt c/o abdominal pain and blood clots.  EMS states puddles of blood with blood clots [facility].  Pt has hx of blood clots, afib, dementia, and HTN.  Pt presents disoriented to place and time, pt has foley catheter in place with leg bag.

## 2014-05-25 NOTE — ED Provider Notes (Signed)
[facility] Emergency Department Provider Note    ____________________________________________  Time seen: 7:10 AM  I have reviewed the triage vital signs and the nursing notes.   HISTORY  Chief Complaint Hematuria   Patient with history of dementia limits history of present illness    HPI Gabriel Valencia is a 81 y.o. male who [facility] for hematuria. Apparently staff noted that there was a large amount of blood in the patient's diaper this morning. Patient has a chronic Foley. Patient reports pain in his buttocks but he does have a significant history of dementia. Patient is on Coumadin.      Past Medical History  Diagnosis Date  . Hypertension   . A-fib   . Dementia     There are no active problems to display for this patient.   History reviewed. No pertinent past surgical history.  No current outpatient prescriptions on file.  Allergies Review of patient's allergies indicates no known allergies.  History reviewed. No pertinent family history.  Social History History  Substance Use Topics  . Smoking status: Unknown If Ever Smoked  . Smokeless tobacco: Not on file  . Alcohol Use: Not on file     Comment: Pt poor historian, unable to give history.    Review of Systems  Constitutional: Negative for fever. Eyes: Negative for visual changes. ENT: Negative for sore throat. Cardiovascular: Negative for chest pain. Respiratory: Negative for shortness of breath. Gastrointestinal: Negative for abdominal pain, vomiting and diarrhea. Genitourinary: Positive for hematuria Musculoskeletal: Negative for back pain. Skin: Negative for rash. Neurological: Negative for headaches, focal weakness or numbness.   Review of systems Limited by dementia  ____________________________________________   PHYSICAL EXAM:  VITAL SIGNS: ED Triage Vitals  Enc Vitals Group     BP 05/25/14 0633 145/81 mmHg     Pulse Rate  05/25/14 0633 60     Resp 05/25/14 0633 12     Temp 05/25/14 0633 98 F (36.7 C)     Temp Source 05/25/14 0633 Oral     SpO2 05/25/14 0633 100 %     Weight 05/25/14 0633 120 lb (54.432 kg)     Height 05/25/14 0633 5\' 7"  (1.702 m)     Head Cir --      Peak Flow --      Pain Score --      Pain Loc --      Pain Edu? --      Excl. in GC? --      Constitutional: Alert and oriented. Well appearing and in no distress. Eyes: Conjunctivae are normal. PERRL. Normal extraocular movements. ENT   Head: Normocephalic and atraumatic.   Nose: No congestion/rhinnorhea.   Mouth/Throat: Mucous membranes are moist.   Neck: No stridor. Hematological/Lymphatic/Immunilogical: No cervical lymphadenopathy. Cardiovascular: Normal rate, regular rhythm. Normal and symmetric distal pulses are present in all extremities. No murmurs, rubs, or gallops. Respiratory: Normal respiratory effort without tachypnea nor retractions. Breath sounds are clear and equal bilaterally. No wheezes/rales/rhonchi. Gastrointestinal: Soft and nontender. No distention. No abdominal bruits. There is no CVA tenderness. Genitourinary: Patient with Foley catheter in place with some erosion in the posterior  portion of the glans. Large amounts of blood clot surrounding the catheter. Reddish urine in Foley bag. No evidence of infection and surrounding groin Musculoskeletal: Nontender with normal passive range of motion in all extremities. No joint effusions.  No lower extremity tenderness nor edema. Neurologic:   No gross focal neurologic deficits  are appreciated. Speech is normal.  Skin:  Skin is warm, dry and intact. No rash noted. Psychiatric: Mood and affect are normal. Speech and behavior are normal. Patient exhibits appropriate insight and judgment for age    PROCEDURES  Procedure(s) performed: None  Critical Care performed: No  ____________________________________________   INITIAL IMPRESSION / ASSESSMENT AND  PLAN / ED COURSE  Pertinent labs & imaging results that were available during my care of the patient were reviewed by me and considered in my medical decision making (see chart for details).  Discussed with primary care provider Aram Beecham. He recommends discontinuing Coumadin and starting aspirin.   ----------------------------------------- 10:22 AM on 05/25/2014 -----------------------------------------  Unable to flush Foley. We'll attempt to replace. Foley successfully replaced  ----------------------------------------- 12:50 PM on 05/25/2014 -----------------------------------------  Attempt to discharge patient but significant continued bleeding around Foley site will admit.  FINAL CLINICAL IMPRESSION(S) / ED DIAGNOSES  Final diagnoses:  Hematuria     Jene Every, MD 05/25/14 [phone]

## 2014-05-25 NOTE — Consult Note (Signed)
Urology Consult  I have been asked to see the patient by Dr. Enedina FinnerSona Patel, for evaluation and management of gross hematuria.  Chief Complaint: gross hematuria  History of Present Illness: Gabriel Valencia is a 81 y.o. year old who presents to the ED from his nursing home home with severe hematuria.  His bladder is managed by an indwelling Foley catheter and he has had several trips to the ED over the past week with gross hematuria and catheter obstruction for clots.  Each time, his catheter has been irrigated, changed, and he was discharged back to his nursing home.  Today, however, he was noted to have somewhat more severe bleeding both through the catheter and around the Foley.  His catheter was exchanged in the ED today for a 20 Fr 2-way Foley but his bleeding failed to improve.    He is currently on ASA 81 mg as well as coumadin.  INR today 2.1, coumadin not stopped on previous ED visit.    He does have a Urology, Dr. La[facility]le [facility]Borden at [facility], who manages his BPH, chronic urinary retention.    Patient is unable to provide additional history.  His wife is present at the bedside today.  His wife does report that he has a history of pulling out his own catheter and catheter trauma.    Past Medical History  Diagnosis Date  . Hypertension   . A-fib   . Dementia   . Arthritis   . Cataract     Past Surgical History  Procedure Laterality Date  . Cholecystectomy      Home Medications:    Medication List    ASK your doctor about these medications        aspirin EC 81 MG tablet  Take 81 mg by mouth daily.     cephALEXin 500 MG capsule  Commonly known as:  KEFLEX  Take 500 mg by mouth 3 (three) times daily. For 7 days     cetirizine 10 MG tablet  Commonly known as:  ZYRTEC  Take 10 mg by mouth daily.     digoxin 0.125 MG tablet  Commonly known as:  LANOXIN  Take 0.125 mg by mouth daily.     haloperidol 5 MG tablet  Commonly known as:  HALDOL  Take 5 mg by mouth 3 (three)  times daily as needed for agitation.     isosorbide mononitrate 120 MG 24 hr tablet  Commonly known as:  IMDUR  Take 120 mg by mouth daily.     levothyroxine 50 MCG tablet  Commonly known as:  SYNTHROID, LEVOTHROID  Take 50 mcg by mouth daily.     losartan 100 MG tablet  Commonly known as:  COZAAR  Take 100 mg by mouth daily.     metoprolol 100 MG tablet  Commonly known as:  LOPRESSOR  Take 100 mg by mouth 2 (two) times daily.     nitroGLYCERIN 0.4 MG SL tablet  Commonly known as:  NITROSTAT  Place 0.4 mg under the tongue every 5 (five) minutes as needed for chest pain.     omeprazole 20 MG capsule  Commonly known as:  PRILOSEC  Take 20 mg by mouth 2 (two) times daily.     oxybutynin 5 MG tablet  Commonly known as:  DITROPAN  Take 5 mg by mouth 3 (three) times daily.     QUEtiapine 25 MG tablet  Commonly known as:  SEROQUEL  Take 25 mg by mouth every morning.  QUEtiapine 25 MG tablet  Commonly known as:  SEROQUEL  Take 50 mg by mouth daily. Daily every evening at 4 pm     simvastatin 40 MG tablet  Commonly known as:  ZOCOR  Take 40 mg by mouth at bedtime.     traMADol 50 MG tablet  Commonly known as:  ULTRAM  Take 50 mg by mouth every 4 (four) hours as needed for moderate pain.     warfarin 4 MG tablet  Commonly known as:  COUMADIN  Take 4 mg by mouth every evening.  Ask about: Which instructions should I use?        Allergies: No Known Allergies  Family History  Problem Relation Age of Onset  . Stroke Father   . Cancer Brother     Social History:  reports that he does not drink alcohol or use illicit drugs. His tobacco history is not on file.  He is not alert to provide additional history today.    ROS: Unable to obtain ROS due to patients mental status.    Physical Exam:  Vital signs in last 24 hours: Temp:  [97.6 F (36.4 C)-98 F (36.7 C)] 97.6 F (36.4 C) (05/02 1640) Pulse Rate:  [52-107] 107 (05/02 1640) Resp:  [11-19] 14 (05/02  1552) BP: (127-156)/(78-100) 139/97 mmHg (05/02 1640) SpO2:  [95 %-100 %] 95 % (05/02 1640) Weight:  [54.432 kg (120 lb)] 54.432 kg (120 lb) (05/02 8242) Constitutional:  Alert, responds to painful stimuli but unable to communicate.  No acute distress HEENT: [place] AT, moist mucus membranes.  Trachea midline, no masses Cardiovascular: Regular rate and rhythm, no clubbing, cyanosis, or edema. Respiratory: Normal respiratory effort, lungs clear bilaterally GI: Abdomen is soft, nontender, nondistended, no abdominal masses GU: No CVA tenderness bilaterally.  Foley in place draining dark red urine, large softball sized clot at urethral meatus.  Ventral penile erosion to coronal margin with active bleeding noted from meatus around Foley.  Scrotum unremarkable, testicles descended bilaterally.   Skin: No rashes, bruises or suspicious lesions Lymph: No cervical or inguinal adenopathy Neurologic: Grossly intact, no focal deficits, moving all 4 extremities    Laboratory Data:   Recent Labs  05/25/14 0630  WBC 5.4  HGB 12.4*  HCT 37.7*   Hbg stable from previous admissions since 05/02/14, 12.3  No results for input(s): NA, K, CL, CO2, GLUCOSE, BUN, CREATININE, CALCIUM in the last 72 hours.  Recent Labs  05/25/14 0630  INR 2.01   No results for input(s): LABURIN in the last 72 hours. Results for orders placed or performed during the hospital encounter of 05/18/14  Urine culture     Status: None   Collection Time: 05/18/14  7:50 PM  Result Value Ref Range Status   Micro Text Report   Final       SOURCE: INDWELLING CATHETER    ORGANISM 1                >100,000 CFU/ML Candida tropicalis   ORGANISM 2                50,000 CFU GRAM POSITIVE ROD   COMMENT                   -   COMMENT                   -   ANTIBIOTIC  ORG#1                                                  Radiologic Imaging: No results found.  Procedure: 20 Fr Foley removed, new 24 Fr Rusch  hematuria catheter placed using catheter guide into bladder using standard sterile technique.  Balloon inflated with 30 cc sterile water.  Catheter was then hand irrigated with 2 L NS with removal of approximately 100 cc clot until clear.  CBI then initiated on moderate gtt.   Impression/Assessment:  81 yo M with gross hematuria likely related to catheter trauma exacerbated by anticoagulation and antiplatelet therapy.  He also has an erosion of the urethral ventrally of the shaft which appears somewhat acute given active peri-catheter bleeding.  Other etiologies such as bladder tumor, prostatic bleeding, upper tract bleeding, UTI, stones etc should be evaluated as outpatient with cystoscopy, upper tract imaging.    His bladder is now clear of clot and has been started on CBI for management of bleeding.    Plan:  1) Gross hematuria- continue CBI overnight, titrate to light pink and hand irrigate as needed for catheter obstruction.    2) Monitor H/H.  Please stop coumadin/ ASA as possible based in risk factors.  3) Recommend outpatient cystoscopy/ upper tract imaging once discharged.  Ideally, catheter can be downsized once bleeding has resolved prior to discharge.  05/25/2014, 6:26 PM  Vanna Scotland,  MD   Thank you for involving me in this patient's care, I will continue to follow along. Please page with any further questions or concerns.  Vanna Scotland

## 2014-05-25 NOTE — ED Notes (Signed)
While attempting to discharge patient - pt found to have large amount of blood clots to penis and around foley catheter - Dr. Cyril LoosenKinner made aware, plan to admit pt, family updated.

## 2014-05-25 NOTE — ED Notes (Signed)
Upon assessment pt catheter wrapped up in diaper.  Large blood clot noted outside of catheter.

## 2014-05-25 NOTE — ED Notes (Signed)
Dr. Apolinar JunesBrandon, urology - at bedside for catheter replacement, placed a 52F triple lumen catheter w/ 30cc balloon. Dr. Apolinar JunesBrandon then hand irrigated 2L in and out of pt's bladder to remove multiple blood clots. Pt tolerated procedure well.

## 2014-05-25 NOTE — H&P (Signed)
[facility]   Patient Demographics  Gabriel Valencia, is a 81 y.o. male  MRN: [id number]   DOB - 11-Jan-1920  Admit Date - 05/25/2014  Outpatient Primary MD  Dr Osborne Oman  With History of -  Past Medical History  Diagnosis Date  . Hypertension   . A-fib   . Dementia       History reviewed. No pertinent past surgical history.     Chief Complaint  Patient presents with  . Hematuria     HPI  Gabriel Valencia  is a 81 y.o. male  From [facility] with h/o Chronic Afib on coumadin, Dementia, Chronic foley catheter for BPH and urinary retention for 2.5 years comes in 3rd visit in last 1 week with recurrent hematuria. He was prescribed keflex last Monday for UTI. Catheter was replaced today. Pt was ready for discharge however developed severe bleeding and now requires urology eval and further observation  Review of Systems    CONSTITUTIONAL: No documented fever. No fatigue + weakness. No weight gain, no weight loss.  EYES: No blurry or double vision.  ENT: No tinnitus. No postnasal drip. No redness of the oropharynx.  RESPIRATORY: No cough, no wheeze, no hemoptysis. No dyspnea.  CARDIOVASCULAR: No chest pain. No orthopnea. No palpitations. No syncope.  GASTROINTESTINAL: No nausea, no vomiting or diarrhea. No abdominal pain. No melena or hematochezia.  GENITOURINARY: severe hematuria.  ENDOCRINE: No polyuria or nocturia. No heat or cold intolerance.  HEMATOLOGY: No anemia. No bruising. No bleeding.  INTEGUMENTARY: No rashes. No lesions.  MUSCULOSKELETAL: No arthritis. No swelling. No gout.  NEUROLOGIC: No numbness, tingling, or ataxia. No seizure-type activity. +dementia PSYCHIATRIC: No anxiety. No insomnia. No ADD.  A full 10 point Review of Systems was done, except as stated above, all other Review of Systems were negative.   Social  History History  Substance Use Topics  . Smoking status: Unknown If Ever Smoked  . Smokeless tobacco: Not on file  . Alcohol Use: Not on file     Comment: Pt poor historian, unable to give history.     Family History HTN  Prior to Admission medications   Medication Sig Start Date End Date Taking? Authorizing Provider  aspirin EC 81 MG tablet Take 81 mg by mouth daily.   Yes Historical Provider, MD  cephALEXin (KEFLEX) 500 MG capsule Take 500 mg by mouth 3 (three) times daily. For 7 days 05/20/14 05/26/14 Yes Historical Provider, MD  cetirizine (ZYRTEC) 10 MG tablet Take 10 mg by mouth daily.   Yes Historical Provider, MD  digoxin (LANOXIN) 0.125 MG tablet Take 0.125 mg by mouth daily.   Yes Historical Provider, MD  haloperidol (HALDOL) 5 MG tablet Take 5 mg by mouth 3 (three) times daily as needed for agitation.   Yes Historical Provider, MD  isosorbide mononitrate (IMDUR) 120 MG 24 hr tablet Take 120 mg by mouth daily.   Yes Historical Provider, MD  levothyroxine (SYNTHROID, LEVOTHROID) 50 MCG tablet Take 50 mcg by mouth daily.   Yes Historical Provider, MD  losartan (COZAAR) 100 MG tablet Take 100 mg by mouth daily.   Yes Historical Provider, MD  metoprolol (LOPRESSOR) 100 MG tablet Take 100 mg by mouth 2 (two) times daily.   Yes Historical Provider, MD  nitroGLYCERIN (NITROSTAT) 0.4 MG SL tablet Place 0.4  mg under the tongue every 5 (five) minutes as needed for chest pain.   Yes Historical Provider, MD  omeprazole (PRILOSEC) 20 MG capsule Take 20 mg by mouth 2 (two) times daily.   Yes Historical Provider, MD  oxybutynin (DITROPAN) 5 MG tablet Take 5 mg by mouth 3 (three) times daily.   Yes Historical Provider, MD  QUEtiapine (SEROQUEL) 25 MG tablet Take 25 mg by mouth every morning.   Yes Historical Provider, MD  QUEtiapine (SEROQUEL) 25 MG tablet Take 50 mg by mouth daily. Daily every evening at 4 pm   Yes Historical Provider, MD  simvastatin (ZOCOR) 40 MG tablet Take 40 mg by mouth at  bedtime.   Yes Historical Provider, MD  traMADol (ULTRAM) 50 MG tablet Take 50 mg by mouth every 4 (four) hours as needed for moderate pain.   Yes Historical Provider, MD    No Known Allergies  Physical Exam  Vitals  Blood pressure 127/91, pulse 67, temperature 98 F (36.7 C), temperature source Oral, resp. rate 11, height  (1.702 m), weight 54.432 kg (120 lb), SpO2 96 %.   1. General  lying in bed in NAD. Somewhat lethargic  2. Awake Alert, Oriented X 3.  3. No F0cal.Neuro deficits, ALL Cranial.Nerves Intact, Strength 5/5 all 4 extremities, Sensation intact all 4 extremities, Plantars down going.  4. Ears and Eyes appear Normal, Conjunctivae clear, PERRLA. Oral Mucosa dry  5. Supple Neck, No JVD, No cervical lymphadenopathy appriciated, No Carotid Bruits.  6. Symmetrical Chest wall movement, Good air movement bilaterally, CTAB.  7. RRR, No Gallops, Rubs or Murmurs, No Parasternal Heave.  8. Positive Bowel Sounds, Abdomen Soft, No tenderness, No organomegaly appriciated,No rebound -guarding or rigidity. -GU hematuria-severe around the foley catheter  9.  No Cyanosis, Normal Skin Turgor, No Skin Rash or Bruise.  10. Good muscle tone,  joints appear normal , no effusions, Normal ROM.  11. No Palpable Lymph Nodes in Neck or Axillae  CBC  Recent Labs Lab 05/18/14 1950 05/25/14 0630  WBC 5.6 5.4  HGB 12.3* 12.4*  HCT 36.9* 37.7*  PLT 106* 130*  MCV 93 93.1  MCH 31.1 30.7  MCHC 33.4 33.0  RDW 15.0* 15.0*  LYMPHSABS 0.9*  --   MONOABS 0.5  --   EOSABS 0.1  --   BASOSABS 0.0  --    ------------------------------------------------------------------------------------------------------------------  Chemistries   Recent Labs Lab 05/18/14 1950  NA 139  K 4.9  CL 104  CO2 32  GLUCOSE 111*  BUN 29*  CREATININE 0.96  CALCIUM 8.2*  AST 47*  ALT 15*  ALKPHOS 58    ------------------------------------------------------------------------------------------------------------------ estimated creatinine clearance is 37.8 mL/min (by C-G formula based on Cr of 0.96). -----------------------------------------------------------------------------------------------------------------  Coagulation profile  Recent Labs Lab 05/18/14 1950 05/25/14 0630  INR 2.4 2.01    Cardiac Enzymes No results for input(s): CKMB, TROPONINI, MYOGLOBIN in the last 168 hours.  Invalid input(s): CK ------------------------------------------------------------------------------------------------------------------ Invalid input(s): POCBNP Imaging results:   No results found.    Assessment & Plan  1.  Acute cystitis with hematuria with chronic indwelling foley catheter -pt will be admitted for overnite observation -Spoke with Dr Apolinar Junes to see pt -NO coumadin or ASA -cont monitor hgb  -INR 2.04  2. Chornic indwelling foley with pyuria and hematuria -cont keflex  3.Dementia -exelon patch  4.Chronic Afib with RVR -HR controlled -cont digoxin,metoprolol  5.Hypothyroidism -cont synthroid  6.d/c planning- pt is from [place] [facility]  7.DVT Prophylaxis only SCDs  Due  to hematuria    Family Communication: Admission, patients condition and plan of care including tests being ordered have been discussed with pt's wife who indicate understanding and agree with the plan and Code Status.  Code Status DNR (per wife)  Likely DC to  [facility] in am  Time spent in minutes : 50 mins   Argelia Formisano M.D on 05/25/2014 at 1:34 PM

## 2014-05-26 ENCOUNTER — Observation Stay: Payer: PPO

## 2014-05-26 LAB — CBC
HCT: 30.4 % — ABNORMAL LOW (ref 40.0–52.0)
HEMOGLOBIN: 10.5 g/dL — AB (ref 13.0–18.0)
MCH: 32 pg (ref 26.0–34.0)
MCHC: 34.5 g/dL (ref 32.0–36.0)
MCV: 92.7 fL (ref 80.0–100.0)
Platelets: 121 10*3/uL — ABNORMAL LOW (ref 150–440)
RBC: 3.28 MIL/uL — AB (ref 4.40–5.90)
RDW: 15.2 % — ABNORMAL HIGH (ref 11.5–14.5)
WBC: 7.1 10*3/uL (ref 3.8–10.6)

## 2014-05-26 LAB — PROTIME-INR
INR: 1.96
Prothrombin Time: 22.5 seconds — ABNORMAL HIGH (ref 11.4–15.0)

## 2014-05-26 MED ORDER — METOPROLOL TARTRATE 50 MG PO TABS
50.0000 mg | ORAL_TABLET | Freq: Two times a day (BID) | ORAL | Status: DC
  Administered 2014-05-26 – 2014-05-27 (×2): 50 mg via ORAL
  Filled 2014-05-26 (×3): qty 1

## 2014-05-26 MED ORDER — LOSARTAN POTASSIUM 50 MG PO TABS
25.0000 mg | ORAL_TABLET | Freq: Every day | ORAL | Status: DC
  Administered 2014-05-26 – 2014-05-27 (×2): 25 mg via ORAL
  Filled 2014-05-26 (×2): qty 1

## 2014-05-26 MED ORDER — SODIUM CHLORIDE 0.9 % IV SOLN
INTRAVENOUS | Status: DC
  Administered 2014-05-26 (×2): via INTRAVENOUS

## 2014-05-26 NOTE — Care Management (Signed)
Admitted to [facility] with the diagnosis of acute cystitis. [facility]. Wife is Gabriel JonesCarolyn [phone]). Spoke with Mr. Gabriel HectorMcBride at the bedside. Some what confused this morning. Received referral for Hospice referral. Will complete referral after Pallative Care sees Mr. Gabriel HectorMcBride.  Gabriel GreetBrenda S Holland RN MSN Care Management [phone]

## 2014-05-26 NOTE — Progress Notes (Signed)
Update:  Called due to patient BP dropping and patient became minimally responsive.  Pt got significant antihypertensive medications.  NS bolus improved BP some and pt became more responsive, following commands.  Lungs clear on exam.  Additional bolus ordered, monitor.

## 2014-05-26 NOTE — Consult Note (Deleted)
Urology Consult  I have been asked to see the patient by Dr. Enedina Finner, for evaluation and management of gross hematuria.  Chief Complaint: gross hematuria  History of Present Illness: Gabriel Valencia is a 81 y.o. year old who presents to the ED from his nursing home home with severe hematuria.  His bladder is managed by an indwelling Foley catheter and he has had several trips to the ED over the past week with gross hematuria and catheter obstruction for clots.  Each time, his catheter has been irrigated, changed, and he was discharged back to his nursing home.  Yesterday however, he was noted to have somewhat more severe bleeding both through the catheter and around the Foley.  His catheter was exchanged in the ED yesterday for a 20 Fr 2-way Foley but his bleeding failed to improve.    He has not continued his coumadin or ASA during this admission.  INR today 1.96, coumadin not stopped on previous ED visit.    He does have a Urology, Dr. Laverle Patter at [facility], who manages his BPH, chronic urinary retention.    Patient is unable to provide additional history. His wife it not present today, but his wife was present yesterday at the bedside.    His wife does reported that he has a history of pulling out his own catheter and catheter trauma.  Nursing staff stated they did not have to irrigate through the night and no clots are present.      Past Medical History  Diagnosis Date  . Hypertension   . A-fib   . Dementia   . Arthritis   . Cataract     Past Surgical History  Procedure Laterality Date  . Cholecystectomy      Home Medications:    Medication List    ASK your doctor about these medications        aspirin EC 81 MG tablet  Take 81 mg by mouth daily.     cephALEXin 500 MG capsule  Commonly known as:  KEFLEX  Take 500 mg by mouth 3 (three) times daily. For 7 days     cetirizine 10 MG tablet  Commonly known as:  ZYRTEC  Take 10 mg by mouth daily.     digoxin 0.125  MG tablet  Commonly known as:  LANOXIN  Take 0.125 mg by mouth daily.     haloperidol 5 MG tablet  Commonly known as:  HALDOL  Take 5 mg by mouth 3 (three) times daily as needed for agitation.     isosorbide mononitrate 120 MG 24 hr tablet  Commonly known as:  IMDUR  Take 120 mg by mouth daily.     levothyroxine 50 MCG tablet  Commonly known as:  SYNTHROID, LEVOTHROID  Take 50 mcg by mouth daily.     losartan 100 MG tablet  Commonly known as:  COZAAR  Take 100 mg by mouth daily.     metoprolol 100 MG tablet  Commonly known as:  LOPRESSOR  Take 100 mg by mouth 2 (two) times daily.     nitroGLYCERIN 0.4 MG SL tablet  Commonly known as:  NITROSTAT  Place 0.4 mg under the tongue every 5 (five) minutes as needed for chest pain.     omeprazole 20 MG capsule  Commonly known as:  PRILOSEC  Take 20 mg by mouth 2 (two) times daily.     oxybutynin 5 MG tablet  Commonly known as:  DITROPAN  Take 5 mg by mouth  3 (three) times daily.     QUEtiapine 25 MG tablet  Commonly known as:  SEROQUEL  Take 25 mg by mouth every morning.     QUEtiapine 25 MG tablet  Commonly known as:  SEROQUEL  Take 50 mg by mouth daily. Daily every evening at 4 pm     simvastatin 40 MG tablet  Commonly known as:  ZOCOR  Take 40 mg by mouth at bedtime.     traMADol 50 MG tablet  Commonly known as:  ULTRAM  Take 50 mg by mouth every 4 (four) hours as needed for moderate pain.     warfarin 4 MG tablet  Commonly known as:  COUMADIN  Take 4 mg by mouth every evening.  Ask about: Which instructions should I use?       Patient is not continuing his ASA or coumadin during this admission.  Allergies: No Known Allergies  Family History  Problem Relation Age of Onset  . Stroke Father   . Cancer Brother     Social History:  reports that he does not drink alcohol or use illicit drugs. His tobacco history is not on file.  He is not alert to provide additional history today.    ROS: Unable to obtain  ROS due to patients mental status.    Physical Exam:  Vital signs in last 24 hours: Temp:  [97.6 F (36.4 C)-98.3 F (36.8 C)] 98.3 F (36.8 C) (05/03 0458) Pulse Rate:  [35-107] 71 (05/03 0458) Resp:  [11-20] 20 (05/03 0458) BP: (56-156)/(33-100) 112/70 mmHg (05/03 0458) SpO2:  [80 %-100 %] 100 % (05/03 0458) Constitutional:  Alert, able to communicate with simple questions.  No acute distress HEENT: [place] AT, moist mucus membranes.  Trachea midline, no masses Cardiovascular: Regular rate and rhythm, no clubbing, cyanosis, or edema. Respiratory: Normal respiratory effort, lungs clear bilaterally GI: Abdomen is soft, nontender, nondistended, no abdominal masses GU: No CVA tenderness bilaterally.  Foley in place draining dark red urine, bleeding  at urethral meatus.  Ventral penile erosion to coronal margin with active bleeding noted from meatus around Foley.  Scrotum unremarkable, testicles descended bilaterally.   Skin: No rashes, bruises or suspicious lesions Lymph: No cervical or inguinal adenopathy Neurologic: Grossly intact, no focal deficits, moving all 4 extremities    Laboratory Data:   Recent Labs  05/25/14 0630 05/26/14 0453  WBC 5.4 7.1  HGB 12.4* 10.5*  HCT 37.7* 30.4*   Hbg stable from previous admissions since 05/02/14, 12.3  No results for input(s): NA, K, CL, CO2, GLUCOSE, BUN, CREATININE, CALCIUM in the last 72 hours.  Recent Labs  05/25/14 0630 05/26/14 0453  INR 2.01 1.96   No results for input(s): LABURIN in the last 72 hours. Results for orders placed or performed during the hospital encounter of 05/18/14  Urine culture     Status: None   Collection Time: 05/18/14  7:50 PM  Result Value Ref Range Status   Micro Text Report   Final       SOURCE: INDWELLING CATHETER    ORGANISM 1                >100,000 CFU/ML Candida tropicalis   ORGANISM 2                50,000 CFU GRAM POSITIVE ROD   COMMENT                   -   COMMENT                   -  ANTIBIOTIC                    ORG#1                                                  Radiologic Imaging: No results found.  Impression/Assessment:  81 yo M with gross hematuria likely related to catheter trauma exacerbated by anticoagulation and antiplatelet therapy.  He also has an erosion of the urethral ventrally of the shaft which appears somewhat acute given active peri-catheter bleeding.  Other etiologies such as bladder tumor, prostatic bleeding, upper tract bleeding, UTI, stones etc should be evaluated as outpatient with cystoscopy, upper tract imaging.    His bladder is now clear of clot and has been started on CBI for management of bleeding.    Plan:  1) Gross hematuria- continue CBI overnight, titrate to light pink and hand irrigate as needed for catheter obstruction.    2) Monitor H/H.  Coumadin/ ASA as held during this admission.  3) Recommend outpatient cystoscopy/ upper tract imaging once discharged.  Ideally, catheter can be downsized once bleeding has resolved prior to discharge.  05/26/2014, 7:57 AM  Vanna Scotland,  MD   Thank you for involving me in this patient's care, I will continue to follow along. Please page with any further questions or concerns.  Gabriel Valencia

## 2014-05-26 NOTE — Consult Note (Signed)
Goals of care meeting held with wife.  Wife states at baseline pt eats 75-100% of meals, able to feed, dress, and bathe self.  Pt walks with walker at baseline.  Wife states that pt is seen once a month by [facility] and would like to have them come more often if able.  From pt's labs and conversations about pt's baseline, he is not a hospice candidate.  CM updated of HH plan.

## 2014-05-26 NOTE — Progress Notes (Signed)
Gabriel Valencia is a 81 y.o. male  Acute cystitis with hematuria   SUBJECTIVE:  Pt in NAD. Denies pain. Urology consult noted. Urine still blood-tinged.  ______________________________________________________________________  ROS: Review of systems is unremarkable for any active cardiac,respiratory, GI, GU, hematologic, neurologic or psychiatric systems, 10 systems reviewed.  @CMEDLIST @  Past Medical History  Diagnosis Date  . Hypertension   . A-fib   . Dementia   . Arthritis   . Cataract     Past Surgical History  Procedure Laterality Date  . Cholecystectomy      PHYSICAL EXAM:  BP 112/70 mmHg  Pulse 71  Temp(Src) 98.3 F (36.8 C) (Oral)  Resp 20  Ht 5\' 7"  (1.702 m)  Wt 54.432 kg (120 lb)  BMI 18.79 kg/m2  SpO2 100%  Wt Readings from Last 3 Encounters:  05/25/14 54.432 kg (120 lb)            Constitutional: NAD Neck: supple, no thyromegaly Respiratory: CTA, no rales or wheezes Cardiovascular: irregularly irregular, no murmur, no gallop Abdomen: soft, good BS, nontender Extremities: no edema Neuro: alert and oriented, no focal motor or sensory deficits  ASSESSMENT/PLAN:  Labs and imaging studies were reviewed  Will continue po ABX and bladder irrigations. Await f/u from Urology. KUB today. Consult PC and Hospice. F/u labs in AM. Hope to D/C back to facility tomorrow.

## 2014-05-26 NOTE — Progress Notes (Signed)
Urology Consult Follow Up  Subjective: This is a pleasant cachetic male who was admitted for gross hematuria. He has chronic urinary retention managed with an indwelling foley.  He has made several trips to the ED in the past for the hematuria, but it has not cleared.   He had a 24 Fr Rusch hematuria  Catheter placed last evening and is on CBI.  He is found resting comfortably in his bed.  His urine is currently light red tinged and nurses report no clots or the need to irrigate through the night.    Anti-infective's: Anti-infectives    Start     Dose/Rate Route Frequency Ordered Stop   05/25/14 1730  cephALEXin (KEFLEX) capsule 500 mg     500 mg Oral 3 times daily 05/25/14 1728        Current Facility-Administered Medications  Medication Dose Route Frequency Provider Last Rate Last Dose  . 0.9 %  sodium chloride infusion   Intravenous Continuous Oralia Manisavid Willis, MD 125 mL/hr at 05/26/14 [phone]    . acetaminophen (TYLENOL) tablet 650 mg  650 mg Oral Q6H PRN Enedina FinnerSona Patel, MD       Or  . acetaminophen (TYLENOL) suppository 650 mg  650 mg Rectal Q6H PRN Enedina FinnerSona Patel, MD      . bisacodyl (DULCOLAX) EC tablet 5 mg  5 mg Oral Daily PRN Enedina FinnerSona Patel, MD      . cephALEXin (KEFLEX) capsule 500 mg  500 mg Oral TID Enedina FinnerSona Patel, MD   500 mg at 05/26/14 1548  . digoxin (LANOXIN) tablet 0.125 mg  0.125 mg Oral Daily Enedina FinnerSona Patel, MD   0.125 mg at 05/26/14 1008  . haloperidol (HALDOL) tablet 5 mg  5 mg Oral TID PRN Enedina FinnerSona Patel, MD   5 mg at 05/26/14 0532  . levothyroxine (SYNTHROID, LEVOTHROID) tablet 50 mcg  50 mcg Oral QAC breakfast Enedina FinnerSona Patel, MD   50 mcg at 05/26/14 0748  . loratadine (CLARITIN) tablet 10 mg  10 mg Oral Daily Enedina FinnerSona Patel, MD   10 mg at 05/26/14 1008  . losartan (COZAAR) tablet 25 mg  25 mg Oral Daily Marguarite ArbourJeffrey D Sparks, MD   25 mg at 05/26/14 1008  . magnesium hydroxide (MILK OF MAGNESIA) suspension 30 mL  30 mL Oral Daily PRN Enedina FinnerSona Patel, MD   30 mL at 05/26/14 1008  . metoprolol (LOPRESSOR) tablet 50  mg  50 mg Oral BID Marguarite ArbourJeffrey D Sparks, MD   50 mg at 05/26/14 1008  . nitroGLYCERIN (NITROSTAT) SL tablet 0.4 mg  0.4 mg Sublingual Q5 min PRN Enedina FinnerSona Patel, MD      . ondansetron (ZOFRAN) tablet 4 mg  4 mg Oral Q6H PRN Enedina FinnerSona Patel, MD       Or  . ondansetron (ZOFRAN) injection 4 mg  4 mg Intravenous Q6H PRN Enedina FinnerSona Patel, MD      . oxybutynin (DITROPAN) tablet 5 mg  5 mg Oral TID Enedina FinnerSona Patel, MD   5 mg at 05/26/14 1548  . pantoprazole (PROTONIX) EC tablet 40 mg  40 mg Oral Daily Enedina FinnerSona Patel, MD   40 mg at 05/26/14 1008  . QUEtiapine (SEROQUEL) tablet 25 mg  25 mg Oral Gabriela EvesBH-q7a Sona Patel, MD   25 mg at 05/26/14 0748  . QUEtiapine (SEROQUEL) tablet 50 mg  50 mg Oral Daily Enedina FinnerSona Patel, MD   50 mg at 05/26/14 1548  . simvastatin (ZOCOR) tablet 40 mg  40 mg Oral QHS Enedina FinnerSona Patel, MD   40  mg at 05/25/14 2008  . sodium chloride 0.9 % bolus 1,000 mL  1,000 mL Intravenous Once Oralia Manis, MD      . traMADol Janean Sark) tablet 50 mg  50 mg Oral Q4H PRN Enedina Finner, MD         Objective: Vital signs in last 24 hours: Temp:  [97.6 F (36.4 C)-98.5 F (36.9 C)] 98 F (36.7 C) (05/03 1510) Pulse Rate:  [35-88] 56 (05/03 1510) Resp:  [17-20] 17 (05/03 1510) BP: (56-135)/(33-76) 99/58 mmHg (05/03 1510) SpO2:  [80 %-100 %] 98 % (05/03 1510) Weight:  [65.318 kg (144 lb)] 65.318 kg (144 lb) (05/03 1436)  Intake/Output from previous day: 05/02 0701 - 05/03 0700 In: 16109  Out: 18950  Intake/Output this shift:     Physical Exam General; WD, WN, alert but not oriented  HEENT: RRR. No peripheral edema. Lungs: CTAB Abdomen: soft, non-tender, bladder not palpable GU:  Foley catheter in place, blood oozing from the meatus, ventral penile erosion to coronal margin. Scrotal without edema, testicles are normal bilaterally Lab Results:   Recent Labs  05/25/14 0630 05/26/14 0453  WBC 5.4 7.1  HGB 12.4* 10.5*  HCT 37.7* 30.4*  PLT 130* 121*   BMET No results for input(s): NA, K, CL, CO2, GLUCOSE, BUN,  CREATININE, CALCIUM in the last 72 hours. PT/INR  Recent Labs  05/25/14 0630 05/26/14 0453  LABPROT 22.9* 22.5*  INR 2.01 1.96   ABG No results for input(s): PHART, HCO3 in the last 72 hours.  Invalid input(s): PCO2, PO2  Studies/Results: Dg Abd 1 View  05/26/2014   CLINICAL DATA:  81 year old male with hematuria and abdominal pain. Initial encounter.  EXAM: ABDOMEN - 1 VIEW  COMPARISON:  Report of [facility] abdomen series 10/29/2013 (no images available). [facility] CT Abdomen and Pelvis 04/05/2012, and earlier.  FINDINGS: Portable AP view of the abdomen and pelvis at 0830 hours. A catheter projects over the central pelvis. Non obstructed bowel gas pattern. Stable cholecystectomy clips. No nephrolithiasis. Small pelvic phleboliths appear stable. No definite urologic calculus. Chronic degenerative changes in the spine.  IMPRESSION: Non obstructed bowel gas pattern and no definite urologic calculus identified.  Bladder catheter versus rectal tube projects over the pelvis.   Electronically Signed   By: Odessa Fleming M.D.   On: 05/26/2014 08:41     Assessment: 81 y/o gentleman with gross hematuria likely related to catheter trauma exacerbated by anticoagulation and antiplatelet therapy.  He also has an erosion of the urethral ventrally of the shaft which may be acute given the active bleeding around the catheter.  Patient should still be evaluated on an outpatient basis for hematuria for other etiologies such as stones, bladder tumor, prostatic bleeding, upper tract bleeding, and UTI with CT Urogram and cystoscopy.  Patient continues to be on CBI.    Plan: 1) For the hematuria-continue CBI, titrating to  a light pink and hand irrigate as needed for catheter obstruction.  2)  Monitor Hbg/HCT.  Coumadin and ASA are held for now.  3) Recommend outpatient cystoscopy/upper tract imaging once discharged.  Hopefully, the catheter can be downsized once the bleeding has resolved  prior to discharge.          SHANNON MCGOWAN 05/26/2014   Patient seen and examined.  Continue CBI, hopefully titrate off tomorrow AM.  Agree with the above assessment and plan.    Vanna Scotland  05/26/2014 8:03 PM

## 2014-05-26 NOTE — Consult Note (Signed)
Palliative Medicine Inpatient Consult Note   Name: Gabriel Valencia Date: 05/26/2014 MRN: [id number]  DOB: Jun 21, 1919  Referring Physician: Enedina Finner, MD  Palliative Care consult requested for this 81 y.o. male for goals of medical therapy in patient with Alzheimer's Dementia, BPH with chronic foley catheter, h/o hematuria, and a-fib admitted with hematuria.    REVIEW OF SYSTEMS:  Pain: None Dyspnea:  No Nausea/Vomiting:  No Constipation:   No All other systems were reviewed and found to be negative   SOCIAL HISTORY: Pt lives at [facility]. Pt is married.  LEGAL DOCUMENTS:   Health Care Power of Attorney:  No, information provided.    CODE STATUS: DNR  PAST MEDICAL HISTORY: Past Medical History  Diagnosis Date  . Hypertension   . A-fib   . Dementia   . Arthritis   . Cataract     PAST SURGICAL HISTORY:  Past Surgical History  Procedure Laterality Date  . Cholecystectomy      ALLERGIES:  has No Known Allergies.  MEDICATIONS:  Current Facility-Administered Medications  Medication Dose Route Frequency Provider Last Rate Last Dose  . 0.9 %  sodium chloride infusion   Intravenous Continuous Oralia Manis, MD 125 mL/hr at 05/26/14 0145    . acetaminophen (TYLENOL) tablet 650 mg  650 mg Oral Q6H PRN Enedina Finner, MD       Or  . acetaminophen (TYLENOL) suppository 650 mg  650 mg Rectal Q6H PRN Enedina Finner, MD      . bisacodyl (DULCOLAX) EC tablet 5 mg  5 mg Oral Daily PRN Enedina Finner, MD      . cephALEXin (KEFLEX) capsule 500 mg  500 mg Oral TID Enedina Finner, MD   500 mg at 05/25/14 2008  . digoxin (LANOXIN) tablet 0.125 mg  0.125 mg Oral Daily Enedina Finner, MD   0.125 mg at 05/25/14 1850  . haloperidol (HALDOL) tablet 5 mg  5 mg Oral TID PRN Enedina Finner, MD   5 mg at 05/26/14 0532  . levothyroxine (SYNTHROID, LEVOTHROID) tablet 50 mcg  50 mcg Oral QAC breakfast Enedina Finner, MD   50 mcg at 05/26/14 0748  . loratadine (CLARITIN) tablet 10 mg  10 mg Oral Daily Enedina Finner, MD   10  mg at 05/25/14 1849  . losartan (COZAAR) tablet 25 mg  25 mg Oral Daily Marguarite Arbour, MD      . magnesium hydroxide (MILK OF MAGNESIA) suspension 30 mL  30 mL Oral Daily PRN Enedina Finner, MD      . metoprolol (LOPRESSOR) tablet 50 mg  50 mg Oral BID Marguarite Arbour, MD      . nitroGLYCERIN (NITROSTAT) SL tablet 0.4 mg  0.4 mg Sublingual Q5 min PRN Enedina Finner, MD      . ondansetron (ZOFRAN) tablet 4 mg  4 mg Oral Q6H PRN Enedina Finner, MD       Or  . ondansetron (ZOFRAN) injection 4 mg  4 mg Intravenous Q6H PRN Enedina Finner, MD      . oxybutynin (DITROPAN) tablet 5 mg  5 mg Oral TID Enedina Finner, MD   5 mg at 05/25/14 1848  . pantoprazole (PROTONIX) EC tablet 40 mg  40 mg Oral Daily Enedina Finner, MD   40 mg at 05/25/14 1849  . QUEtiapine (SEROQUEL) tablet 25 mg  25 mg Oral Gabriela Eves, MD   25 mg at 05/26/14 0748  . QUEtiapine (SEROQUEL) tablet 50 mg  50 mg Oral Daily Enedina Finner,  MD   50 mg at 05/25/14 1849  . simvastatin (ZOCOR) tablet 40 mg  40 mg Oral QHS Enedina FinnerSona Patel, MD   40 mg at 05/25/14 2008  . sodium chloride 0.9 % bolus 1,000 mL  1,000 mL Intravenous Once Oralia Manisavid Willis, MD      . traMADol Janean Sark(ULTRAM) tablet 50 mg  50 mg Oral Q4H PRN Enedina FinnerSona Patel, MD        Vital Signs: BP 112/70 mmHg  Pulse 71  Temp(Src) 98.3 F (36.8 C) (Oral)  Resp 20  Ht 5\' 7"  (1.702 m)  Wt 54.432 kg (120 lb)  BMI 18.79 kg/m2  SpO2 100% Filed Weights   05/25/14 0633  Weight: 54.432 kg (120 lb)    Estimated body mass index is 18.79 kg/(m^2) as calculated from the following:   Height as of this encounter: 5\' 7"  (1.702 m).   Weight as of this encounter: 54.432 kg (120 lb).   PERFORMANCE STATUS (ECOG) : 2 - Symptomatic, <50% confined to bed  PHYSICAL EXAM: Head: Normocephalic, without obvious abnormality Neck: supple, symmetrical, trachea midline and thyroid not enlarged, symmetric, no tenderness/mass/nodules Resp: clear to auscultation bilaterally Cardio: regular rate and rhythm, S1, S2 normal, no murmur,  click, rub or gallop Genitalia: penis exam: foley catheter present, no trauma noted Extremities: extremities normal, atraumatic, no cyanosis or edema Neurologic: Grossly normal, pt alert to person, pt able to state attending physicina and that he saw a  "lady" urologist in ER yesterday.  LABS: CBC:    Component Value Date/Time   WBC 7.1 05/26/2014 0453   WBC 5.6 05/18/2014 1950   HGB 10.5* 05/26/2014 0453   HGB 12.3* 05/18/2014 1950   HCT 30.4* 05/26/2014 0453   HCT 36.9* 05/18/2014 1950   PLT 121* 05/26/2014 0453   PLT 106* 05/18/2014 1950   MCV 92.7 05/26/2014 0453   MCV 93 05/18/2014 1950   NEUTROABS 4.1 05/18/2014 1950   LYMPHSABS 0.9* 05/18/2014 1950   MONOABS 0.5 05/18/2014 1950   EOSABS 0.1 05/18/2014 1950   BASOSABS 0.0 05/18/2014 1950   Comprehensive Metabolic Panel:    Component Value Date/Time   NA 139 05/18/2014 1950   K 4.9 05/18/2014 1950   CL 104 05/18/2014 1950   CO2 32 05/18/2014 1950   BUN 29* 05/18/2014 1950   CREATININE 0.96 05/18/2014 1950   GLUCOSE 111* 05/18/2014 1950   CALCIUM 8.2* 05/18/2014 1950   AST 47* 05/18/2014 1950   ALT 15* 05/18/2014 1950   ALKPHOS 58 05/18/2014 1950   PROT 5.5* 05/18/2014 1950   ALBUMIN 3.1* 05/18/2014 1950    IMPRESSION:  Pt presented to ER from [facility] with hematuria. Urine in ER was positive for gross hematuria and anemia. Pt since admission has started CBI and CBC shows stable hemoglobin.  Pt is currently sitting up in bed and feeding himself breakfast.  Pt labs show albumin is 3.1.  Reviewing chart I am not able to find a Hospice Diagnosis.  Pt states wife will be in this morning.  Will speak with wife to discuss goals of care going forward.  PLAN: 1. Goals of care with family when present   More than 50% of the visit was spent in counseling/coordination of care: Yes  Time Spent: 75 minutes

## 2014-05-26 NOTE — Progress Notes (Signed)
BP 88/54; MAP 61; HR 53 after 2nd fluid bolus. Dr. Anne HahnWillis notified, parameters set, Q4H vitals ordered, fluid order changed NS@125ml /hr

## 2014-05-26 NOTE — Progress Notes (Signed)
Initially BP 56/33; HR 74; MAP 38. Pt. Minimally responsive compared to initial assessment.  Oncall MD paged. Rapid response called. Nursing supervisor arrived and started first NaCl fluid bolus.

## 2014-05-26 NOTE — Progress Notes (Signed)
Dr. Anne HahnWillis stated to start another 1 liter fluid bolus if MAP below 60 or systolic BP under 100. BP 78/48; MAP 55; HR 62. Will notify Dr. Anne HahnWillis when second bolus is complete.

## 2014-05-26 NOTE — Plan of Care (Signed)
Problem: Discharge Progression Outcomes Goal: Other Discharge Outcomes/Goals Outcome: Progressing Pt prefers Gabriel Valencia. Pt has a hx of htn, dementia, and A-fib controlled by home meds. [facility] is from [facility].    Plan of care progress to goal for:  Pain: no c/o pain.  Hemodynamically stable: BP slightly low, but stable. MD aware. CBI infusing. Maintaining urine at a slight pink color. Tolerating diet: pt followed by dietician. Tolerating food well. Eating 50-75% of meals today. Activity: 2 person assist. Patient has some generalized weakness.

## 2014-05-26 NOTE — Clinical Social Work Note (Signed)
Clinical Social Work Assessment  Patient Details  Name: Gabriel Valencia MRN: [id number] Date of Birth: August 23, 1919  Date of referral:  05/26/14               Reason for consult:  Facility Placement                Permission sought to share information with:  Chartered certified accountant granted to share information::  Yes, Verbal Permission Granted  Name::      Dispensing optician::   Assisted Living Facility  Relationship::     Contact Information:     Housing/Transportation Living arrangements for the past 2 months:  [place] of Information:  Spouse Patient Interpreter Needed:  None Criminal Activity/Legal Involvement Pertinent to Current Situation/Hospitalization:  No - Comment as needed Significant Relationships:  Adult Children, Spouse Lives with:  Facility Resident Do you feel safe going back to the place where you live?  Yes Need for family participation in patient care:  Yes (Comment)  Social Worker assessment / plan: Clinical Social Worker (CSW) met with patient and his wife Gabriel Valencia [phone] at bedside. Patient was talking on the phone with his daughter Gabriel Valencia. Wife reported that patient has been a resident at [facility] since March 18th, 2016. Per wife she lives in [place]. Wife is agreeable for patient returning to [facility] when medically stable. Wife reported that he has been in and out of the hospital for the past 3 weeks per wife. Wife is satisfied with care at the [facility]. CSW will continue to follow and assist as needed.   Blima Rich, LCSWA [phone]    Employment status:  Retired Nurse, adult PT Recommendations:  Not assessed at this time Information / Referral to community resources:  Other (Comment Required) ([place] )  Patient/Family's Response to care: Patient's wife is agreeable to patient returing to [facility].   Emotional  Assessment Appearance:  Appears older than stated age, Disheveled Attitude/Demeanor/Rapport:    Affect (typically observed):  Pleasant, Quiet Orientation:  Oriented to Self Alcohol / Substance use:  Not Applicable Psych involvement (Current and /or in the community):  No (Comment)  Discharge Needs  Concerns to be addressed:  No discharge needs identified Readmission within the last 30 days:  Yes Current discharge risk:  Chronically ill Barriers to Discharge:  Continued Medical Work up   Loralyn Freshwater, LCSW 05/26/2014, 3:41 PM

## 2014-05-26 NOTE — Progress Notes (Signed)
Initial Nutrition Assessment  DOCUMENTATION CODES:  Severe malnutrition in context of chronic illness  INTERVENTION:  Magic cup with meals  NUTRITION DIAGNOSIS:  Malnutrition related to chronic illness as evidenced by severe depletion of body fat, severe depletion of muscle mass.    GOAL:  Patient will meet greater than or equal to 90% of their needs    MONITOR:  PO intake, Weight trends, Supplement acceptance, I & O's, Labs  REASON FOR ASSESSMENT:  Malnutrition Screening Tool    ASSESSMENT:  Pt with hx of chronic afib on coumadin, dementia, and chronic foley for BPH admitted from SNF with hematuria, catheter changed.  Pt unable to answer most questions due to dementia. Wife at bedside and reports no recent weight changes x 1 year but overall has lost 40 lb in the last 5 years.  Pt feeds himself.  She states that when pt was living at home his PO intake was not always good, since being in SNF pt has been eating about 75-100% of his meals.  He does not like ensure/boost.  Per RN pt consumed 50% of breakfast this am.   Nutrition-focused physical exam completed, severe depletion of fat and muscle identified.   Glucose Profile:  Recent Labs  05/25/14 2203  GLUCAP 115*     Height:  Ht Readings from Last 1 Encounters:  05/25/14 5\' 7"  (1.702 m)    Weight:  Wt Readings from Last 1 Encounters:  05/26/14 144 lb (65.318 kg)    Ideal Body Weight:     Wt Readings from Last 10 Encounters:  05/26/14 144 lb (65.318 kg)    BMI:  Body mass index is 22.55 kg/(m^2).  Estimated Nutritional Needs:  Kcal:  1500-1700  Protein:  75-85 grams  Fluid:  > 1.5 L/day  Skin:  Reviewed, no issues  Diet Order:  DIET SOFT Room service appropriate?: Yes; Fluid consistency:: Thin  EDUCATION NEEDS:  No education needs identified at this time   Intake/Output Summary (Last 24 hours) at 05/26/14 1449 Last data filed at 05/26/14 1320  Gross per 24 hour  Intake  [id number]  ml  Output  22050 ml  Net  -1500 ml    Last BM:  5/2  Kendell BaneHeather Mitul Hallowell RD, LDN, CNSC [phone] Pager [phone] After Hours Pager

## 2014-05-26 NOTE — Plan of Care (Signed)
Problem: Discharge Progression Outcomes Goal: Discharge plan in place and appropriate Individualization of Care  Pt prefers [name]. Pt has a hx of htn, dementia, and A-fib controlled by home meds. Pt is from [facility].     Goal: Other Discharge Outcomes/Goals Plan of care, progress to goal: Pt bp became low during shift, rapid response called, doc put in orders for fluid bolus and vitals q4 with parameters, pt bp is improving. CBI continues, pt continues to bleed from split meatus, but amount has decreased.

## 2014-05-27 ENCOUNTER — Encounter: Payer: Self-pay | Admitting: Neurology

## 2014-05-27 DIAGNOSIS — E43 Unspecified severe protein-calorie malnutrition: Secondary | ICD-10-CM | POA: Insufficient documentation

## 2014-05-27 LAB — PROTIME-INR
INR: 1.57
Prothrombin Time: 19 seconds — ABNORMAL HIGH (ref 11.4–15.0)

## 2014-05-27 LAB — BASIC METABOLIC PANEL
Anion gap: 3 — ABNORMAL LOW (ref 5–15)
BUN: 12 mg/dL (ref 6–20)
CALCIUM: 8 mg/dL — AB (ref 8.9–10.3)
CO2: 29 mmol/L (ref 22–32)
CREATININE: 0.77 mg/dL (ref 0.61–1.24)
Chloride: 110 mmol/L (ref 101–111)
GFR calc Af Amer: >60 mL/min (ref >60)
GLUCOSE: 92 mg/dL (ref 65–99)
Potassium: 4.1 mmol/L (ref 3.5–5.1)
SODIUM: 142 mmol/L (ref 135–145)

## 2014-05-27 LAB — CBC WITH DIFFERENTIAL/PLATELET
Basophils Absolute: 0 10*3/uL (ref 0–0.1)
Basophils Relative: 0 %
Eosinophils Absolute: 0.1 10*3/uL (ref 0–0.7)
Eosinophils Relative: 2 %
HEMATOCRIT: 31.1 % — AB (ref 40.0–52.0)
Hemoglobin: 10.4 g/dL — ABNORMAL LOW (ref 13.0–18.0)
Lymphs Abs: 0.9 10*3/uL — ABNORMAL LOW (ref 1.0–3.6)
MCH: 31.2 pg (ref 26.0–34.0)
MCHC: 33.6 g/dL (ref 32.0–36.0)
MCV: 93 fL (ref 80.0–100.0)
Monocytes Absolute: 0.4 10*3/uL (ref 0.2–1.0)
NEUTROS ABS: 3.6 10*3/uL (ref 1.4–6.5)
Neutrophils Relative %: 73 %
Platelets: 117 10*3/uL — ABNORMAL LOW (ref 150–440)
RBC: 3.34 MIL/uL — AB (ref 4.40–5.90)
RDW: 15.1 % — ABNORMAL HIGH (ref 11.5–14.5)
WBC: 5 10*3/uL (ref 3.8–10.6)

## 2014-05-27 MED ORDER — LORATADINE 10 MG PO TABS: 10.0000 mg | ORAL_TABLET | Freq: Every day | ORAL | Status: DC

## 2014-05-27 MED ORDER — MAGNESIUM HYDROXIDE 400 MG/5ML PO SUSP: 30.0000 mL | Freq: Every day | ORAL | Status: DC | PRN

## 2014-05-27 MED ORDER — BISACODYL 5 MG PO TBEC: 5.0000 mg | DELAYED_RELEASE_TABLET | Freq: Every day | ORAL | Status: AC | PRN

## 2014-05-27 MED ORDER — LOSARTAN POTASSIUM 25 MG PO TABS: 25.0000 mg | ORAL_TABLET | Freq: Every day | ORAL | Status: DC

## 2014-05-27 MED ORDER — METOPROLOL TARTRATE 50 MG PO TABS: 50.0000 mg | ORAL_TABLET | Freq: Two times a day (BID) | ORAL | Status: DC

## 2014-05-27 MED ORDER — ACETAMINOPHEN 325 MG PO TABS: 650.0000 mg | ORAL_TABLET | Freq: Four times a day (QID) | ORAL | Status: AC | PRN

## 2014-05-27 MED ORDER — ONDANSETRON HCL 4 MG PO TABS
4.0000 mg | ORAL_TABLET | Freq: Four times a day (QID) | ORAL | Status: DC | PRN
Start: 2014-05-27 — End: 2014-06-15

## 2014-05-27 NOTE — Progress Notes (Signed)
Urology Consult Follow Up  Subjective: Hematuria resolved, CBI now off with urine clear. Hematocrit stable.  Anti-infectives: Anti-infectives    Start     Dose/Rate Route Frequency Ordered Stop   05/25/14 1730  cephALEXin (KEFLEX) capsule 500 mg     500 mg Oral 3 times daily 05/25/14 1728        Current Facility-Administered Medications  Medication Dose Route Frequency Provider Last Rate Last Dose  . 0.9 %  sodium chloride infusion   Intravenous Continuous Oralia Manisavid Willis, MD 125 mL/hr at 05/26/14 [phone]    . acetaminophen (TYLENOL) tablet 650 mg  650 mg Oral Q6H PRN Enedina FinnerSona Patel, MD       Or  . acetaminophen (TYLENOL) suppository 650 mg  650 mg Rectal Q6H PRN Enedina FinnerSona Patel, MD      . bisacodyl (DULCOLAX) EC tablet 5 mg  5 mg Oral Daily PRN Enedina FinnerSona Patel, MD      . cephALEXin (KEFLEX) capsule 500 mg  500 mg Oral TID Enedina FinnerSona Patel, MD   500 mg at 05/27/14 1053  . digoxin (LANOXIN) tablet 0.125 mg  0.125 mg Oral Daily Enedina FinnerSona Patel, MD   0.125 mg at 05/27/14 1054  . haloperidol (HALDOL) tablet 5 mg  5 mg Oral TID PRN Enedina FinnerSona Patel, MD   5 mg at 05/26/14 2215  . levothyroxine (SYNTHROID, LEVOTHROID) tablet 50 mcg  50 mcg Oral QAC breakfast Enedina FinnerSona Patel, MD   50 mcg at 05/27/14 1104  . loratadine (CLARITIN) tablet 10 mg  10 mg Oral Daily Enedina FinnerSona Patel, MD   10 mg at 05/27/14 1053  . losartan (COZAAR) tablet 25 mg  25 mg Oral Daily Marguarite ArbourJeffrey D Sparks, MD   25 mg at 05/27/14 1053  . magnesium hydroxide (MILK OF MAGNESIA) suspension 30 mL  30 mL Oral Daily PRN Enedina FinnerSona Patel, MD   30 mL at 05/26/14 1008  . metoprolol (LOPRESSOR) tablet 50 mg  50 mg Oral BID Marguarite ArbourJeffrey D Sparks, MD   50 mg at 05/27/14 1053  . nitroGLYCERIN (NITROSTAT) SL tablet 0.4 mg  0.4 mg Sublingual Q5 min PRN Enedina FinnerSona Patel, MD      . ondansetron (ZOFRAN) tablet 4 mg  4 mg Oral Q6H PRN Enedina FinnerSona Patel, MD       Or  . ondansetron (ZOFRAN) injection 4 mg  4 mg Intravenous Q6H PRN Enedina FinnerSona Patel, MD      . oxybutynin (DITROPAN) tablet 5 mg  5 mg Oral TID Enedina FinnerSona Patel, MD    5 mg at 05/27/14 1054  . pantoprazole (PROTONIX) EC tablet 40 mg  40 mg Oral Daily Enedina FinnerSona Patel, MD   40 mg at 05/27/14 1053  . QUEtiapine (SEROQUEL) tablet 25 mg  25 mg Oral Gabriela EvesBH-q7a Sona Patel, MD   25 mg at 05/27/14 [id number]  . QUEtiapine (SEROQUEL) tablet 50 mg  50 mg Oral Daily Enedina FinnerSona Patel, MD   50 mg at 05/26/14 1548  . simvastatin (ZOCOR) tablet 40 mg  40 mg Oral QHS Enedina FinnerSona Patel, MD   40 mg at 05/26/14 2023  . traMADol (ULTRAM) tablet 50 mg  50 mg Oral Q4H PRN Enedina FinnerSona Patel, MD         Objective: Vital signs in last 24 hours: Temp:  [97.7 F (36.5 C)-98.1 F (36.7 C)] 97.8 F (36.6 C) (05/04 0507) Pulse Rate:  [56-99] 65 (05/04 0507) Resp:  [17] 17 (05/04 0507) BP: (99-142)/(58-94) 128/94 mmHg (05/04 0507) SpO2:  [93 %-100 %] 100 % (05/04 0507)  Intake/Output from previous  day: 05/03 0701 - 05/04 0700 In: [id number] [P.O.:350] Out: 11300 [Urine:3500] Intake/Output this shift: Total I/O In: 480 [P.O.:480] Out: 2500 [Other:2500]   Physical Exam  Lab Results:   Recent Labs  05/26/14 0453 05/27/14 0508  WBC 7.1 5.0  HGB 10.5* 10.4*  HCT 30.4* 31.1*  PLT 121* 117*   BMET  Recent Labs  05/27/14 0508  NA 142  K 4.1  CL 110  CO2 29  GLUCOSE 92  BUN 12  CREATININE 0.77  CALCIUM 8.0*   PT/INR  Recent Labs  05/26/14 0453 05/27/14 0508  LABPROT 22.5* 19.0*  INR 1.96 1.57   ABG No results for input(s): PHART, HCO3 in the last 72 hours.  Invalid input(s): PCO2, PO2  Studies/Results: Dg Abd 1 View  05/26/2014   CLINICAL DATA:  81 year old male with hematuria and abdominal pain. Initial encounter.  EXAM: ABDOMEN - 1 VIEW  COMPARISON:  Report of [facility] abdomen series 10/29/2013 (no images available). [facility] CT Abdomen and Pelvis 04/05/2012, and earlier.  FINDINGS: Portable AP view of the abdomen and pelvis at 0830 hours. A catheter projects over the central pelvis. Non obstructed bowel gas pattern. Stable cholecystectomy clips. No  nephrolithiasis. Small pelvic phleboliths appear stable. No definite urologic calculus. Chronic degenerative changes in the spine.  IMPRESSION: Non obstructed bowel gas pattern and no definite urologic calculus identified.  Bladder catheter versus rectal tube projects over the pelvis.   Electronically Signed   By: Odessa FlemingH  Hall M.D.   On: 05/26/2014 08:41     Assessment: 81 y/o gentleman with gross hematuria likely related to catheter trauma exacerbated by anticoagulation and antiplatelet therapy. He also has an erosion of the urethral ventrally of the shaft which may be acute given the active bleeding around the catheter. Patient should still be evaluated on an outpatient basis for hematuria for other etiologies such as stones, bladder tumor, prostatic bleeding, upper tract bleeding, and UTI with CT Urogram and cystoscopy. CBI now off and urine clear.  Additionally, given urethral erosion, may consider SPT as outpt.    Plan: 1) Hematuria- resolved.  Recommend exchanging 24 Fr 3 way catheter for more comfortable 18 Fr 2 way coude catheter  2) Recommend outpatient cystoscopy/upper tract imaging once discharged. He is a patient of [facility] and discussed this with his wife.  He has a follow up next month with Dr. Sherron MondayMacDiarmid.          Gabriel ScotlandBRANDON, Gabriel Valencia 05/27/2014

## 2014-05-27 NOTE — Progress Notes (Signed)
[facility].  CSW informed pt and facility staff of discharge.  CSW sent d/c documentation to facility.  Pt will be transported by his wife via car.  CSW provided room and report information to RN.  CSW signing off as there are no further needs at this time.   MulberryLynn Calianne Larue, Kentucky[place]CSW [phone]

## 2014-05-27 NOTE — Discharge Instructions (Signed)
Dr. Judithann SheenSparks requests that you STOP THE PATIENT'S coumadin. We will switch him to 81 mg Aspirin   Hematuria Hematuria is blood in your urine. It can be caused by a bladder infection, kidney infection, prostate infection, kidney stone, or cancer of your urinary tract. Infections can usually be treated with medicine, and a kidney stone usually will pass through your urine. If neither of these is the cause of your hematuria, further workup to find out the reason may be needed. It is very important that you tell your health care provider about any blood you see in your urine, even if the blood stops without treatment or happens without causing pain. Blood in your urine that happens and then stops and then happens again can be a symptom of a very serious condition. Also, pain is not a symptom in the initial stages of many urinary cancers. HOME CARE INSTRUCTIONS   Drink lots of fluid, 3-4 quarts a day. If you have been diagnosed with an infection, cranberry juice is especially recommended, in addition to large amounts of water.  Avoid caffeine, tea, and carbonated beverages because they tend to irritate the bladder.  Avoid alcohol because it may irritate the prostate.  Take all medicines as directed by your health care provider.  If you were prescribed an antibiotic medicine, finish it all even if you start to feel better.  If you have been diagnosed with a kidney stone, follow your health care provider's instructions regarding straining your urine to catch the stone.  Empty your bladder often. Avoid holding urine for long periods of time.  After a bowel movement, women should cleanse front to back. Use each tissue only once.  Empty your bladder before and after sexual intercourse if you are a male. SEEK MEDICAL CARE IF:  You develop back pain.  You have a fever.  You have a feeling of sickness in your stomach (nausea) or vomiting.  Your symptoms are not better in 3 days. Return sooner if  you are getting worse. SEEK IMMEDIATE MEDICAL CARE IF:   You develop severe vomiting and are unable to keep the medicine down.  You develop severe back or abdominal pain despite taking your medicines.  You begin passing a large amount of blood or clots in your urine.  You feel extremely weak or faint, or you pass out. MAKE SURE YOU:   Understand these instructions.  Will watch your condition.  Will get help right away if you are not doing well or get worse. Document Released: 01/09/2005 Document Revised: 05/26/2013 Document Reviewed: 09/09/2012 [facility] Patient Information 2015 [employer] [place]. This information is not intended to replace advice given to you by your health care provider. Make sure you discuss any questions you have with your health care provider.

## 2014-05-27 NOTE — Discharge Summary (Signed)
Gabriel Valencia, is a 81 y.o. male  DOB September 15, 1919  MRN [id number].  Admission date:  05/25/2014  Admitting Physician  Fritzi Mandes, MD  Discharge Date:  05/27/2014   Primary MD  No primary care provider on file.  Recommendations for primary care physician for things to follow:   bleeding   Admission Diagnosis  Hematuria [R31.9]   Discharge Diagnosis  Hematuria [R31.9]  Chronic a-fib Dementia  Principal Problem:   Acute cystitis with hematuria Active Problems:   Hematuria   Protein-calorie malnutrition, severe      Past Medical History  Diagnosis Date  . Hypertension   . A-fib   . Dementia   . Arthritis   . Cataract     Past Surgical History  Procedure Laterality Date  . Cholecystectomy         History of present illness and  Hospital Course:     Kindly see H&P for history of present illness and admission details, please review complete Labs, Consult reports and Test reports for all details in brief  HPI  from the history and physical done on the day of admission    Hospital Course    Pt admitted for hematuria and cystitis. Taken off Coumadin. Bladder irrigations done. Urology consult done. Hematuria improved. Hgb remained stable. Now transferred back to SNF for further care with Foley in place.   Discharge Condition: stable   Follow UP  Follow-up Information    Follow up with Your urologist. Call in 1 day.      Follow up with Yvette Loveless D, MD In 1 week.   Specialty:  Internal Medicine   Contact information:   [place] [address] [phone]         Discharge Instructions  and  Discharge Medications   Foley catheter with care per protocol Soft diet CBC, Met B in 1 week PT consult for ambulation.     Medication List    STOP taking these medications        cephALEXin 500 MG capsule  Commonly known as:   KEFLEX     cetirizine 10 MG tablet  Commonly known as:  ZYRTEC  Replaced by:  loratadine 10 MG tablet     isosorbide mononitrate 120 MG 24 hr tablet  Commonly known as:  IMDUR     warfarin 4 MG tablet  Commonly known as:  COUMADIN      TAKE these medications        acetaminophen 325 MG tablet  Commonly known as:  TYLENOL  Take 2 tablets (650 mg total) by mouth every 6 (six) hours as needed for mild pain (or Fever >/= 101).     aspirin EC 81 MG tablet  Take 81 mg by mouth daily.     bisacodyl 5 MG EC tablet  Commonly known as:  DULCOLAX  Take 1 tablet (5 mg total) by mouth daily as needed for moderate constipation.     digoxin 0.125 MG tablet  Commonly known  as:  LANOXIN  Take 0.125 mg by mouth daily.     haloperidol 5 MG tablet  Commonly known as:  HALDOL  Take 5 mg by mouth 3 (three) times daily as needed for agitation.     levothyroxine 50 MCG tablet  Commonly known as:  SYNTHROID, LEVOTHROID  Take 50 mcg by mouth daily.     loratadine 10 MG tablet  Commonly known as:  CLARITIN  Take 1 tablet (10 mg total) by mouth daily.     losartan 25 MG tablet  Commonly known as:  COZAAR  Take 1 tablet (25 mg total) by mouth daily.     magnesium hydroxide 400 MG/5ML suspension  Commonly known as:  MILK OF MAGNESIA  Take 30 mLs by mouth daily as needed for mild constipation.     metoprolol 50 MG tablet  Commonly known as:  LOPRESSOR  Take 1 tablet (50 mg total) by mouth 2 (two) times daily.     nitroGLYCERIN 0.4 MG SL tablet  Commonly known as:  NITROSTAT  Place 0.4 mg under the tongue every 5 (five) minutes as needed for chest pain.     omeprazole 20 MG capsule  Commonly known as:  PRILOSEC  Take 20 mg by mouth 2 (two) times daily.     ondansetron 4 MG tablet  Commonly known as:  ZOFRAN  Take 1 tablet (4 mg total) by mouth every 6 (six) hours as needed for nausea.     oxybutynin 5 MG tablet  Commonly known as:  DITROPAN  Take 5 mg by mouth 3 (three) times  daily.     QUEtiapine 25 MG tablet  Commonly known as:  SEROQUEL  Take 25 mg by mouth every morning.     QUEtiapine 25 MG tablet  Commonly known as:  SEROQUEL  Take 50 mg by mouth daily. Daily every evening at 4 pm     simvastatin 40 MG tablet  Commonly known as:  ZOCOR  Take 40 mg by mouth at bedtime.     traMADol 50 MG tablet  Commonly known as:  ULTRAM  Take 50 mg by mouth every 4 (four) hours as needed for moderate pain.          Diet and Activity recommendation: See Discharge Instructions above   Consults obtained - Urology, Social Work, Palliative Care   Major procedures and Radiology Reports - PLEASE review detailed and final reports for all details, in brief -   See records   Dg Abd 1 View  05/26/2014   CLINICAL DATA:  81 year old male with hematuria and abdominal pain. Initial encounter.  EXAM: ABDOMEN - 1 VIEW  COMPARISON:  Report of [facility] abdomen series 10/29/2013 (no images available). [facility] CT Abdomen and Pelvis 04/05/2012, and earlier.  FINDINGS: Portable AP view of the abdomen and pelvis at 0830 hours. A catheter projects over the central pelvis. Non obstructed bowel gas pattern. Stable cholecystectomy clips. No nephrolithiasis. Small pelvic phleboliths appear stable. No definite urologic calculus. Chronic degenerative changes in the spine.  IMPRESSION: Non obstructed bowel gas pattern and no definite urologic calculus identified.  Bladder catheter versus rectal tube projects over the pelvis.   Electronically Signed   By: Genevie Ann M.D.   On: 05/26/2014 08:41    Micro Results   See records  Recent Results (from the past 240 hour(s))  Urine culture     Status: None   Collection Time: 05/18/14  7:50 PM  Result Value Ref Range  Status   Micro Text Report   Final       SOURCE: INDWELLING CATHETER    ORGANISM 1                >100,000 CFU/ML Candida tropicalis   ORGANISM 2                50,000 CFU GRAM POSITIVE ROD   COMMENT                    -   COMMENT                   -   ANTIBIOTIC                    ORG#1                                                    Today   Subjective:   Gabriel Valencia today has no headache,no chest abdominal pain,no new weakness tingling or numbness, feels much better wants to go home today.   Objective:   Blood pressure 128/94, pulse 65, temperature 97.8 F (36.6 C), temperature source Oral, resp. rate 17, height '5\' 7"'  (1.702 m), weight 65.318 kg (144 lb), SpO2 100 %.   Intake/Output Summary (Last 24 hours) at 05/27/14 0727 Last data filed at 05/27/14 0100  Gross per 24 hour  Intake  18850 ml  Output  11300 ml  Net   7550 ml    Exam Awake Alert, Oriented x 3, No new F.N deficits, Normal affect [place].AT,PERRAL Supple Neck,No JVD, No cervical lymphadenopathy appriciated.  Symmetrical Chest wall movement, Good air movement bilaterally, CTAB Irregularly irregular,No Gallops,Rubs or new Murmurs, No Parasternal Heave +ve B.Sounds, Abd Soft, Non tender, No organomegaly appriciated, No rebound -guarding or rigidity. No Cyanosis, Clubbing or edema, No new Rash or bruise  Data Review   CBC w Diff: Lab Results  Component Value Date   WBC 5.0 05/27/2014   WBC 5.6 05/18/2014   HGB 10.4* 05/27/2014   HGB 12.3* 05/18/2014   HCT 31.1* 05/27/2014   HCT 36.9* 05/18/2014   PLT 117* 05/27/2014   PLT 106* 05/18/2014   LYMPHOPCT 17% 05/27/2014   LYMPHOPCT 15.4 05/18/2014   MONOPCT 8% 05/27/2014   MONOPCT 9.3 05/18/2014   EOSPCT 2% 05/27/2014   EOSPCT 1.4 05/18/2014   BASOPCT 0% 05/27/2014   BASOPCT 0.3 05/18/2014    CMP: Lab Results  Component Value Date   NA 142 05/27/2014   NA 139 05/18/2014   K 4.1 05/27/2014   K 4.9 05/18/2014   CL 110 05/27/2014   CL 104 05/18/2014   CO2 29 05/27/2014   CO2 32 05/18/2014   BUN 12 05/27/2014   BUN 29* 05/18/2014   CREATININE 0.77 05/27/2014   CREATININE 0.96 05/18/2014   PROT 5.5* 05/18/2014   ALBUMIN 3.1* 05/18/2014    ALKPHOS 58 05/18/2014   AST 47* 05/18/2014   ALT 15* 05/18/2014  .   Total Time in preparing paper work, data evaluation and todays exam - 80 minutes  Ranada Vigorito D M.D on 05/27/2014 at 7:27 AM

## 2014-06-02 ENCOUNTER — Encounter: Payer: Self-pay | Admitting: Emergency Medicine

## 2014-06-02 ENCOUNTER — Inpatient Hospital Stay
Admission: EM | Admit: 2014-06-02 | Discharge: 2014-06-05 | DRG: 690 | Payer: PPO | Attending: Internal Medicine | Admitting: Internal Medicine

## 2014-06-02 DIAGNOSIS — N179 Acute kidney failure, unspecified: Secondary | ICD-10-CM | POA: Diagnosis present

## 2014-06-02 DIAGNOSIS — M1990 Unspecified osteoarthritis, unspecified site: Secondary | ICD-10-CM | POA: Diagnosis present

## 2014-06-02 DIAGNOSIS — F0280 Dementia in other diseases classified elsewhere without behavioral disturbance: Secondary | ICD-10-CM | POA: Diagnosis present

## 2014-06-02 DIAGNOSIS — E785 Hyperlipidemia, unspecified: Secondary | ICD-10-CM | POA: Diagnosis present

## 2014-06-02 DIAGNOSIS — N3001 Acute cystitis with hematuria: Secondary | ICD-10-CM | POA: Diagnosis present

## 2014-06-02 DIAGNOSIS — Z87440 Personal history of urinary (tract) infections: Secondary | ICD-10-CM | POA: Diagnosis not present

## 2014-06-02 DIAGNOSIS — N368 Other specified disorders of urethra: Secondary | ICD-10-CM | POA: Diagnosis present

## 2014-06-02 DIAGNOSIS — E039 Hypothyroidism, unspecified: Secondary | ICD-10-CM | POA: Diagnosis present

## 2014-06-02 DIAGNOSIS — Z7982 Long term (current) use of aspirin: Secondary | ICD-10-CM | POA: Diagnosis not present

## 2014-06-02 DIAGNOSIS — E860 Dehydration: Secondary | ICD-10-CM | POA: Diagnosis present

## 2014-06-02 DIAGNOSIS — B9689 Other specified bacterial agents as the cause of diseases classified elsewhere: Secondary | ICD-10-CM | POA: Diagnosis present

## 2014-06-02 DIAGNOSIS — Y731 Therapeutic (nonsurgical) and rehabilitative gastroenterology and urology devices associated with adverse incidents: Secondary | ICD-10-CM | POA: Diagnosis present

## 2014-06-02 DIAGNOSIS — N390 Urinary tract infection, site not specified: Secondary | ICD-10-CM

## 2014-06-02 DIAGNOSIS — Z66 Do not resuscitate: Secondary | ICD-10-CM | POA: Diagnosis present

## 2014-06-02 DIAGNOSIS — Z823 Family history of stroke: Secondary | ICD-10-CM

## 2014-06-02 DIAGNOSIS — G301 Alzheimer's disease with late onset: Secondary | ICD-10-CM | POA: Diagnosis not present

## 2014-06-02 DIAGNOSIS — I482 Chronic atrial fibrillation: Secondary | ICD-10-CM | POA: Diagnosis present

## 2014-06-02 DIAGNOSIS — I10 Essential (primary) hypertension: Secondary | ICD-10-CM | POA: Diagnosis present

## 2014-06-02 DIAGNOSIS — T839XXA Unspecified complication of genitourinary prosthetic device, implant and graft, initial encounter: Secondary | ICD-10-CM

## 2014-06-02 DIAGNOSIS — R310 Gross hematuria: Secondary | ICD-10-CM | POA: Insufficient documentation

## 2014-06-02 DIAGNOSIS — T8383XA Hemorrhage of genitourinary prosthetic devices, implants and grafts, initial encounter: Secondary | ICD-10-CM | POA: Diagnosis present

## 2014-06-02 DIAGNOSIS — N401 Enlarged prostate with lower urinary tract symptoms: Secondary | ICD-10-CM | POA: Diagnosis present

## 2014-06-02 DIAGNOSIS — D72829 Elevated white blood cell count, unspecified: Secondary | ICD-10-CM | POA: Diagnosis present

## 2014-06-02 DIAGNOSIS — G309 Alzheimer's disease, unspecified: Secondary | ICD-10-CM | POA: Diagnosis present

## 2014-06-02 DIAGNOSIS — N3000 Acute cystitis without hematuria: Secondary | ICD-10-CM | POA: Diagnosis present

## 2014-06-02 DIAGNOSIS — Z515 Encounter for palliative care: Secondary | ICD-10-CM | POA: Diagnosis not present

## 2014-06-02 LAB — URINALYSIS COMPLETE WITH MICROSCOPIC (ARMC ONLY)
BILIRUBIN URINE: NEGATIVE
Glucose, UA: NEGATIVE mg/dL
Ketones, ur: NEGATIVE mg/dL
Nitrite: NEGATIVE
Protein, ur: 100 mg/dL — AB
SPECIFIC GRAVITY, URINE: 1.013 (ref 1.005–1.030)
Squamous Epithelial / LPF: NONE SEEN
pH: 5 (ref 5.0–8.0)

## 2014-06-02 LAB — CBC WITH DIFFERENTIAL/PLATELET
BASOS ABS: 0 10*3/uL (ref 0–0.1)
BASOS PCT: 0 %
Eosinophils Absolute: 0 10*3/uL (ref 0–0.7)
Eosinophils Relative: 0 %
HCT: 34.8 % — ABNORMAL LOW (ref 40.0–52.0)
Hemoglobin: 11.5 g/dL — ABNORMAL LOW (ref 13.0–18.0)
LYMPHS PCT: 5 %
Lymphs Abs: 0.6 10*3/uL — ABNORMAL LOW (ref 1.0–3.6)
MCH: 31.1 pg (ref 26.0–34.0)
MCHC: 33.2 g/dL (ref 32.0–36.0)
MCV: 93.9 fL (ref 80.0–100.0)
MONO ABS: 1.1 10*3/uL — AB (ref 0.2–1.0)
Monocytes Relative: 9 %
NEUTROS ABS: 10.4 10*3/uL — AB (ref 1.4–6.5)
Neutrophils Relative %: 86 %
Platelets: 158 10*3/uL (ref 150–440)
RBC: 3.71 MIL/uL — AB (ref 4.40–5.90)
RDW: 15.8 % — ABNORMAL HIGH (ref 11.5–14.5)
WBC: 12 10*3/uL — AB (ref 3.8–10.6)

## 2014-06-02 LAB — BASIC METABOLIC PANEL
ANION GAP: 8 (ref 5–15)
BUN: 27 mg/dL — ABNORMAL HIGH (ref 6–20)
CHLORIDE: 106 mmol/L (ref 101–111)
CO2: 27 mmol/L (ref 22–32)
Calcium: 8.4 mg/dL — ABNORMAL LOW (ref 8.9–10.3)
Creatinine, Ser: 1.54 mg/dL — ABNORMAL HIGH (ref 0.61–1.24)
GFR calc Af Amer: 43 mL/min — ABNORMAL LOW (ref >60)
GFR calc non Af Amer: 37 mL/min — ABNORMAL LOW (ref >60)
GLUCOSE: 139 mg/dL — AB (ref 65–99)
Potassium: 4.5 mmol/L (ref 3.5–5.1)
Sodium: 141 mmol/L (ref 135–145)

## 2014-06-02 LAB — APTT: aPTT: 32 seconds (ref 24–36)

## 2014-06-02 LAB — PROTIME-INR
INR: 1.14
Prothrombin Time: 14.8 seconds (ref 11.4–15.0)

## 2014-06-02 MED ORDER — CEFTRIAXONE SODIUM 1 G IJ SOLR
1.0000 g | Freq: Once | INTRAMUSCULAR | Status: AC
  Administered 2014-06-02: 1 g via INTRAVENOUS

## 2014-06-02 MED ORDER — LEVOTHYROXINE SODIUM 25 MCG PO TABS
50.0000 ug | ORAL_TABLET | Freq: Every day | ORAL | Status: DC
  Administered 2014-06-03 – 2014-06-05 (×3): 50 ug via ORAL
  Filled 2014-06-02 (×3): qty 2

## 2014-06-02 MED ORDER — SODIUM CHLORIDE 0.9 % IV BOLUS (SEPSIS)
500.0000 mL | Freq: Once | INTRAVENOUS | Status: AC
  Administered 2014-06-02: 500 mL via INTRAVENOUS

## 2014-06-02 MED ORDER — OXYBUTYNIN CHLORIDE 5 MG PO TABS
5.0000 mg | ORAL_TABLET | Freq: Three times a day (TID) | ORAL | Status: DC
  Administered 2014-06-03 – 2014-06-04 (×7): 5 mg via ORAL
  Filled 2014-06-02 (×7): qty 1

## 2014-06-02 MED ORDER — SODIUM CHLORIDE 0.9 % IV SOLN
INTRAVENOUS | Status: DC
  Administered 2014-06-03 – 2014-06-04 (×2): via INTRAVENOUS

## 2014-06-02 MED ORDER — CEFTRIAXONE SODIUM 1 G IJ SOLR
INTRAMUSCULAR | Status: AC
  Administered 2014-06-02: 1 g via INTRAVENOUS
  Filled 2014-06-02: qty 10

## 2014-06-02 MED ORDER — DIGOXIN 125 MCG PO TABS
0.1250 mg | ORAL_TABLET | Freq: Every day | ORAL | Status: DC
  Administered 2014-06-03 – 2014-06-04 (×2): 0.125 mg via ORAL
  Filled 2014-06-02 (×2): qty 1

## 2014-06-02 MED ORDER — QUETIAPINE FUMARATE 25 MG PO TABS
50.0000 mg | ORAL_TABLET | Freq: Every day | ORAL | Status: DC
  Administered 2014-06-03 – 2014-06-04 (×3): 50 mg via ORAL
  Filled 2014-06-02 (×3): qty 2

## 2014-06-02 MED ORDER — ONDANSETRON HCL 4 MG/2ML IJ SOLN: 4.0000 mg | Freq: Four times a day (QID) | INTRAMUSCULAR | Status: DC | PRN

## 2014-06-02 MED ORDER — HALOPERIDOL 1 MG PO TABS
5.0000 mg | ORAL_TABLET | Freq: Three times a day (TID) | ORAL | Status: DC | PRN
  Administered 2014-06-03 – 2014-06-05 (×2): 5 mg via ORAL
  Filled 2014-06-02 (×2): qty 5

## 2014-06-02 MED ORDER — ISOSORBIDE MONONITRATE ER 60 MG PO TB24
120.0000 mg | ORAL_TABLET | Freq: Every day | ORAL | Status: DC
  Administered 2014-06-03 – 2014-06-04 (×2): 120 mg via ORAL
  Filled 2014-06-02 (×2): qty 2

## 2014-06-02 MED ORDER — MAGNESIUM HYDROXIDE 400 MG/5ML PO SUSP
30.0000 mL | Freq: Every day | ORAL | Status: DC | PRN
  Filled 2014-06-02: qty 30

## 2014-06-02 MED ORDER — LORATADINE 10 MG PO TABS
10.0000 mg | ORAL_TABLET | Freq: Every day | ORAL | Status: DC
  Administered 2014-06-03 – 2014-06-04 (×2): 10 mg via ORAL
  Filled 2014-06-02 (×2): qty 1

## 2014-06-02 MED ORDER — PIPERACILLIN-TAZOBACTAM 3.375 G IVPB
3.3750 g | Freq: Three times a day (TID) | INTRAVENOUS | Status: DC
  Administered 2014-06-03 (×3): 3.375 g via INTRAVENOUS
  Filled 2014-06-02 (×6): qty 50

## 2014-06-02 MED ORDER — PANTOPRAZOLE SODIUM 40 MG PO TBEC
40.0000 mg | DELAYED_RELEASE_TABLET | Freq: Every day | ORAL | Status: DC
  Administered 2014-06-03 – 2014-06-04 (×2): 40 mg via ORAL
  Filled 2014-06-02 (×2): qty 1

## 2014-06-02 MED ORDER — ACETAMINOPHEN 650 MG RE SUPP: 650.0000 mg | Freq: Four times a day (QID) | RECTAL | Status: DC | PRN

## 2014-06-02 MED ORDER — BISACODYL 5 MG PO TBEC
5.0000 mg | DELAYED_RELEASE_TABLET | Freq: Every day | ORAL | Status: DC | PRN
  Administered 2014-06-04: 5 mg via ORAL
  Filled 2014-06-02: qty 1

## 2014-06-02 MED ORDER — QUETIAPINE FUMARATE 25 MG PO TABS
25.0000 mg | ORAL_TABLET | Freq: Every morning | ORAL | Status: DC
  Administered 2014-06-03 – 2014-06-04 (×2): 25 mg via ORAL
  Filled 2014-06-02 (×2): qty 1

## 2014-06-02 MED ORDER — ACETAMINOPHEN 325 MG PO TABS
650.0000 mg | ORAL_TABLET | Freq: Four times a day (QID) | ORAL | Status: DC | PRN
Start: 2014-06-02 — End: 2014-06-05
  Administered 2014-06-04: 650 mg via ORAL
  Filled 2014-06-02: qty 2

## 2014-06-02 MED ORDER — FLUCONAZOLE IN SODIUM CHLORIDE 200-0.9 MG/100ML-% IV SOLN
200.0000 mg | Freq: Once | INTRAVENOUS | Status: AC
  Administered 2014-06-02: 200 mg via INTRAVENOUS
  Filled 2014-06-02: qty 100

## 2014-06-02 MED ORDER — NITROGLYCERIN 0.4 MG SL SUBL: 0.4000 mg | SUBLINGUAL_TABLET | SUBLINGUAL | Status: DC | PRN

## 2014-06-02 MED ORDER — METOPROLOL TARTRATE 50 MG PO TABS
50.0000 mg | ORAL_TABLET | Freq: Two times a day (BID) | ORAL | Status: DC
  Administered 2014-06-03 – 2014-06-04 (×5): 50 mg via ORAL
  Filled 2014-06-02 (×5): qty 1

## 2014-06-02 MED ORDER — QUETIAPINE FUMARATE 25 MG PO TABS
25.0000 mg | ORAL_TABLET | Freq: Two times a day (BID) | ORAL | Status: DC
Start: 2014-06-02 — End: 2014-06-02

## 2014-06-02 MED ORDER — ONDANSETRON HCL 4 MG PO TABS: 4.0000 mg | ORAL_TABLET | Freq: Four times a day (QID) | ORAL | Status: DC | PRN

## 2014-06-02 MED ORDER — SIMVASTATIN 40 MG PO TABS
40.0000 mg | ORAL_TABLET | Freq: Every day | ORAL | Status: DC
  Administered 2014-06-03 – 2014-06-04 (×3): 40 mg via ORAL
  Filled 2014-06-02 (×3): qty 1

## 2014-06-02 NOTE — H&P (Signed)
[facility]. [facility] [place] [facility]   PATIENT NAME: Gabriel PerchesOdell Dentinger    MR#:  [id number]  DATE OF BIRTH:  Nov 21, 1919  DATE OF ADMISSION:  06/02/2014  PRIMARY CARE PHYSICIAN: No PCP Per Patient   REQUESTING/REFERRING PHYSICIAN: Dr.Gayle. Chief Complaint  Patient presents with  . Hematuria    HISTORY OF PRESENT ILLNESS:  Gabriel Valencia  is a 81 y.o. male with a known history of chronic Foley catheter for BPH and urinary retention, recurrent hematuria and UTI, chronic A. fib and dementia. The patient is not responsive to verbal stimuli and unable to provide any information. According to the patient's wife, patient was found to have a hematuria today. Then, he was sent from assisted living to the ED for further evaluation.he was found to have dislodged Foley catheter, which was changed by ED physician. However, the patient was found to have a UTI with leukocytosis and renal failure. According to the patient's wife, the patient usually walks with Foley catheter. He was treated with a UTI and hematuria  in the hospital, and at discharge to assistant living facility 6 days ago. The patient has been confused and dehydrated for the past 2-3 days. The patient has a history of chronic A. fib, was on Coumadin. The Coumadin was taken off due to hematuria during last hospitalization.   PAST MEDICAL HISTORY:   Past Medical History  Diagnosis Date  . Hypertension   . A-fib   . Dementia   . Arthritis   . Cataract     PAST SURGICAL HISTORY:   Past Surgical History  Procedure Laterality Date  . Cholecystectomy      SOCIAL HISTORY:   History  Substance Use Topics  . Smoking status: Smoker, Current Status Unknown  . Smokeless tobacco: Not on file  . Alcohol Use: No     Comment: Pt poor historian, unable to give history.    FAMILY HISTORY:   Family History  Problem Relation Age of Onset  . Stroke Father   . Cancer Brother     DRUG ALLERGIES:  No Known  Allergies  REVIEW OF SYSTEMS:  The patient is a demented and unresponsive to verbal sputum stimuli, unable to provide any review of systems.  MEDICATIONS AT HOME:   Prior to Admission medications   Medication Sig Start Date End Date Taking? Authorizing Provider  acetaminophen (TYLENOL) 325 MG tablet Take 2 tablets (650 mg total) by mouth every 6 (six) hours as needed for mild pain (or Fever >/= 101). 05/27/14  Yes Marguarite ArbourJeffrey D Sparks, MD  aspirin EC 81 MG tablet Take 81 mg by mouth daily.   Yes Historical Provider, MD  bisacodyl (DULCOLAX) 5 MG EC tablet Take 1 tablet (5 mg total) by mouth daily as needed for moderate constipation. 05/27/14  Yes Marguarite ArbourJeffrey D Sparks, MD  digoxin (LANOXIN) 0.125 MG tablet Take 0.125 mg by mouth daily.   Yes Historical Provider, MD  haloperidol (HALDOL) 5 MG tablet Take 5 mg by mouth 3 (three) times daily as needed for agitation.   Yes Historical Provider, MD  isosorbide mononitrate (IMDUR) 120 MG 24 hr tablet Take 120 mg by mouth daily.   Yes Historical Provider, MD  levothyroxine (SYNTHROID, LEVOTHROID) 50 MCG tablet Take 50 mcg by mouth daily.   Yes Historical Provider, MD  loratadine (CLARITIN) 10 MG tablet Take 1 tablet (10 mg total) by mouth daily. 05/27/14  Yes Marguarite ArbourJeffrey D Sparks, MD  losartan (COZAAR) 25 MG tablet Take 1 tablet (  25 mg total) by mouth daily. 05/27/14  Yes Marguarite ArbourJeffrey D Sparks, MD  magnesium hydroxide (MILK OF MAGNESIA) 400 MG/5ML suspension Take 30 mLs by mouth daily as needed for mild constipation. 05/27/14  Yes Marguarite ArbourJeffrey D Sparks, MD  metoprolol (LOPRESSOR) 50 MG tablet Take 1 tablet (50 mg total) by mouth 2 (two) times daily. 05/27/14  Yes Marguarite ArbourJeffrey D Sparks, MD  nitroGLYCERIN (NITROSTAT) 0.4 MG SL tablet Place 0.4 mg under the tongue every 5 (five) minutes as needed for chest pain.   Yes Historical Provider, MD  omeprazole (PRILOSEC) 20 MG capsule Take 20 mg by mouth 2 (two) times daily.   Yes Historical Provider, MD  ondansetron (ZOFRAN) 4 MG tablet Take 1 tablet  (4 mg total) by mouth every 6 (six) hours as needed for nausea. 05/27/14  Yes Marguarite ArbourJeffrey D Sparks, MD  oxybutynin (DITROPAN) 5 MG tablet Take 5 mg by mouth 3 (three) times daily.   Yes Historical Provider, MD  QUEtiapine (SEROQUEL) 25 MG tablet Take 25-50 mg by mouth 2 (two) times daily. 25 mg AM; 50 mg PM   Yes Historical Provider, MD  simvastatin (ZOCOR) 40 MG tablet Take 40 mg by mouth at bedtime.   Yes Historical Provider, MD      VITAL SIGNS:  Blood pressure 139/84, pulse 74, temperature 98.4 F (36.9 C), temperature source Oral, resp. rate 12, SpO2 100 %.  PHYSICAL EXAMINATION:  GENERAL:  81 y.o.-year-old patient lying in the bed with no acute distress.  EYES: Pupils equal, round, reactive to light and accommodation. No scleral icterus. Extraocular muscles intact.  HEENT: Head atraumatic, normocephalic. Oropharynx and nasopharynx clear.  NECK:  Supple, no jugular venous distention. No thyroid enlargement, no tenderness.  LUNGS: Normal breath sounds bilaterally, no wheezing, rales,rhonchi or crepitation. No use of accessory muscles of respiration.  CARDIOVASCULAR: S1, S2 normal. No murmurs, rubs, or gallops.  ABDOMEN: Soft, nontender, nondistended. Bowel sounds present. No organomegaly or mass.  EXTREMITIES: No pedal edema, cyanosis, or clubbing.  NEUROLOGIC: Cranial nerves II through XII are intact. Muscle strength 5/5 in all extremities. Sensation intact. Gait not checked.  PSYCHIATRIC: The patient is alert and oriented x 3.  SKIN: No obvious rash, lesion, or ulcer.   LABORATORY PANEL:   CBC  Recent Labs Lab 06/02/14 1910  WBC 12.0*  HGB 11.5*  HCT 34.8*  PLT 158   ------------------------------------------------------------------------------------------------------------------  Chemistries   Recent Labs Lab 06/02/14 1910  NA 141  K 4.5  CL 106  CO2 27  GLUCOSE 139*  BUN 27*  CREATININE 1.54*  CALCIUM 8.4*    ------------------------------------------------------------------------------------------------------------------  Cardiac Enzymes No results for input(s): TROPONINI in the last 168 hours. ------------------------------------------------------------------------------------------------------------------  RADIOLOGY:  No results found.  EKG:  No orders found for this or any previous visit.  IMPRESSION AND PLAN:   Acute cystitis with hematuria and leukocytosis. I will start Zosyn and a follow-up urine culture and the CBC.   acute renal failure with dehydration. I will start IV fluid support and follow-up BMP, hold losartan. Anemia. Follow-up hemoglobin. Chronic A. fib. The patient is off Coumadin but on aspirin. I will discontinue aspirin, and continue Lopressor. Dementia. Aspiration and fall precaution.   All the records are reviewed and case discussed with ED provider. Management plans discussed with the patient's wife and she is in agreement.  CODE STATUS: DO NOT RESUSCITATE per patient's wife .  TOTAL TIME TAKING CARE OF THIS PATIENT: 56 minutes.    Shaune Pollackhen, Joclyn Alsobrook M.D on 06/02/2014 at 9:43 PM  Between 7am to 6pm - Pager - [phone]  After 6pm go to [email or web address] - password [email or web address] [facility]  [facility]  Office  [phone]  CC: Primary care physician; No PCP Per Patient

## 2014-06-02 NOTE — ED Notes (Signed)
Pt's catheter repositioned, cloudy yellow urine returned at this time, sent for specimen. Pt denies pelvic pain after catheter reposition.

## 2014-06-02 NOTE — ED Notes (Signed)
[facility]; c/o blood in urine that started today; Pt with catheter in place upon arrival, dried blood to head of penis and bloody urine in bag. MD to bedside. EMS reports afib on monitor, was taken off coumadin last time he was here for same sx.

## 2014-06-02 NOTE — Progress Notes (Signed)
ANTIBIOTIC CONSULT NOTE - INITIAL  Pharmacy Consult for zosyn Indication: Urosepsis  No Known Allergies  Patient Measurements:   Adjusted Body Weight:   Vital Signs: Temp: 98.4 F (36.9 C) (05/10 1850) Temp Source: Oral (05/10 1850) BP: 143/93 mmHg (05/10 2200) Pulse Rate: 69 (05/10 2130) Intake/Output from previous day:   Intake/Output from this shift:    Labs:  Recent Labs  06/02/14 1910  WBC 12.0*  HGB 11.5*  PLT 158  CREATININE 1.54*   Estimated Creatinine Clearance: 28.3 mL/min (by C-G formula based on Cr of 1.54). No results for input(s): VANCOTROUGH, VANCOPEAK, VANCORANDOM, GENTTROUGH, GENTPEAK, GENTRANDOM, TOBRATROUGH, TOBRAPEAK, TOBRARND, AMIKACINPEAK, AMIKACINTROU, AMIKACIN in the last 72 hours.   Microbiology: Recent Results (from the past 720 hour(s))  Urine culture     Status: None   Collection Time: 05/16/14  1:08 PM  Result Value Ref Range Status   Micro Text Report   Final       SOURCE: INDWELLING CATH    COMMENT                   MIXED BACTERIAL ORGANISMS   COMMENT                   RESULTS SUGGESTIVE OF CONTAMINATION   COMMENT                   SUGGEST RECOLLECTION   ANTIBIOTIC                                                      Urine culture     Status: None   Collection Time: 05/18/14  7:50 PM  Result Value Ref Range Status   Micro Text Report   Final       SOURCE: INDWELLING CATHETER    ORGANISM 1                >100,000 CFU/ML Candida tropicalis   ORGANISM 2                50,000 CFU GRAM POSITIVE ROD   COMMENT                   -   COMMENT                   -   ANTIBIOTIC                    ORG#1                                                 Medical History: Past Medical History  Diagnosis Date  . Hypertension   . A-fib   . Dementia   . Arthritis   . Cataract     Medications:  Scheduled:  . fluconazole (DIFLUCAN) IV  200 mg Intravenous Once   Assessment: Pharmacy consulted to dose zosyn in this 81yo F being  treated for possible urosepsis. Patient received ceftriaxone 1gm IV once in the ED. Now being started on zosyn for empiric coverage.  Goal of Therapy:  Resolution of infection  Plan:  Follow up culture results. Zosyn 3.375gm IV Q8hrs ordered as EI.  Pharmacy will continue to follow.  [address] Paris Hill CourtMatt Kulaarwile, VermontPharm D., BCPS 06/02/2014

## 2014-06-02 NOTE — ED Provider Notes (Signed)
[facility] Emergency Department Provider Note  ____________________________________________  Time seen: Approximately 5:15 PM  I have reviewed the triage vital signs and the nursing notes.   HISTORY  Chief Complaint Hematuria   HPI limited secondary to dementia.  HPI Gabriel Valencia is a 81 y.o. male  withchronic dementia, indwelling Foley catheter for BPH, atrial fibrillation not chronically anticoagulated at this point who presents from living facility for blood from Foley catheter. This was noted today is prior to arrival.  had a previous history of this earlier this month which was thought to be located by his chronic anti-anticoagulation on warfarin and this was discontinued.Has been constant today. Severity moderate to severe. It is described is draining black/dark red blood. No modifying factors factors. No recent fevers, no vomiting.    Past Medical History  Diagnosis Date  . Hypertension   . A-fib   . Dementia   . Arthritis   . Cataract     Patient Active Problem List   Diagnosis Date Noted  . Protein-calorie malnutrition, severe 05/27/2014  . Acute cystitis with hematuria 05/25/2014  . Hematuria 05/25/2014    Past Surgical History  Procedure Laterality Date  . Cholecystectomy      Current Outpatient Rx  Name  Route  Sig  Dispense  Refill  . acetaminophen (TYLENOL) 325 MG tablet   Oral   Take 2 tablets (650 mg total) by mouth every 6 (six) hours as needed for mild pain (or Fever >/= 101).   100 tablet   0   . aspirin EC 81 MG tablet   Oral   Take 81 mg by mouth daily.         . bisacodyl (DULCOLAX) 5 MG EC tablet   Oral   Take 1 tablet (5 mg total) by mouth daily as needed for moderate constipation.   30 tablet   0   . digoxin (LANOXIN) 0.125 MG tablet   Oral   Take 0.125 mg by mouth daily.         . haloperidol (HALDOL) 5 MG tablet   Oral   Take 5 mg by mouth 3 (three) times daily as needed for agitation.          . isosorbide mononitrate (IMDUR) 120 MG 24 hr tablet   Oral   Take 120 mg by mouth daily.         Marland Kitchen levothyroxine (SYNTHROID, LEVOTHROID) 50 MCG tablet   Oral   Take 50 mcg by mouth daily.         Marland Kitchen loratadine (CLARITIN) 10 MG tablet   Oral   Take 1 tablet (10 mg total) by mouth daily.   30 tablet   5   . losartan (COZAAR) 25 MG tablet   Oral   Take 1 tablet (25 mg total) by mouth daily.   30 tablet   5   . magnesium hydroxide (MILK OF MAGNESIA) 400 MG/5ML suspension   Oral   Take 30 mLs by mouth daily as needed for mild constipation.   360 mL   0   . metoprolol (LOPRESSOR) 50 MG tablet   Oral   Take 1 tablet (50 mg total) by mouth 2 (two) times daily.   60 tablet   5   . nitroGLYCERIN (NITROSTAT) 0.4 MG SL tablet   Sublingual   Place 0.4 mg under the tongue every 5 (five) minutes as needed for chest pain.         Marland Kitchen omeprazole (PRILOSEC)  20 MG capsule   Oral   Take 20 mg by mouth 2 (two) times daily.         . ondansetron (ZOFRAN) 4 MG tablet   Oral   Take 1 tablet (4 mg total) by mouth every 6 (six) hours as needed for nausea.   20 tablet   0   . oxybutynin (DITROPAN) 5 MG tablet   Oral   Take 5 mg by mouth 3 (three) times daily.         . QUEtiapine (SEROQUEL) 25 MG tablet   Oral   Take 25-50 mg by mouth 2 (two) times daily. 25 mg AM; 50 mg PM         . simvastatin (ZOCOR) 40 MG tablet   Oral   Take 40 mg by mouth at bedtime.           Allergies Review of patient's allergies indicates no known allergies.  Family History  Problem Relation Age of Onset  . Stroke Father   . Cancer Brother     Social History History  Substance Use Topics  . Smoking status: Smoker, Current Status Unknown  . Smokeless tobacco: Not on file  . Alcohol Use: No     Comment: Pt poor historian, unable to give history.    Review of Systems Constitutional: No fever/chills Genitourinary: +hematuria dysuria. Skin: Negative for rash.  Review of  systems obtained from EMS and exam. Patient unable to contribute to review of systems secondary to chronic dementia.   ____________________________________________   PHYSICAL EXAM:  VITAL SIGNS: ED Triage Vitals  Enc Vitals Group     BP 06/02/14 1850 152/96 mmHg     Pulse Rate 06/02/14 1850 77     Resp 06/02/14 1850 18     Temp 06/02/14 1850 98.4 F (36.9 C)     Temp Source 06/02/14 1850 Oral     SpO2 06/02/14 1834 98 %     Weight --      Height --      Head Cir --      Peak Flow --      Pain Score 06/02/14 1851 0     Pain Loc --      Pain Edu? --      Excl. in GC? --     Constitutional: Alert, awake, chronically demented, in no acute distress. Eyes: Conjunctivae are normal. PERRL. EOMI. Head: Atraumatic. Nose: No congestion/rhinnorhea. Mouth/Throat: Mucous membranes are moist.  Oropharynx non-erythematous. Neck: No stridor.  Cardiovascular: irregularly irregular rhythm, normal rateregular rhythm. Grossly normal heart sounds.  Good peripheral circulation. Respiratory: Normal respiratory effort.  No retractions. Lungs CTAB. Gastrointestinal: Mild suprapubic tenderness, No abdominal bruits. No CVA tenderness. Genitourinary: dried blood at the urethral meatus, Foley in place draining dark red urine  Musculoskeletal: No lower extremity tenderness nor edema.  No joint effusions. Neurologic: attempts to communicate but for the most part his speech is not very intelligible his dementia, he appears to move all his extremities equally  Skin:  Skin is warm, dry and intact. No rash noted. Psychiatric: Mood and affect are normal. Speech and behavior are normal.  ____________________________________________   LABS (all labs ordered are listed, but only abnormal results are displayed)  Labs Reviewed  URINALYSIS COMPLETEWITH MICROSCOPIC ([facility])  - Abnormal; Notable for the following:    Color, Urine YELLOW (*)    APPearance TURBID (*)    Hgb urine dipstick 3+ (*)    Protein, ur  100 (*)  Leukocytes, UA 3+ (*)    Bacteria, UA MANY (*)    All other components within normal limits  CBC WITH DIFFERENTIAL/PLATELET - Abnormal; Notable for the following:    WBC 12.0 (*)    RBC 3.71 (*)    Hemoglobin 11.5 (*)    HCT 34.8 (*)    RDW 15.8 (*)    Neutro Abs 10.4 (*)    Lymphs Abs 0.6 (*)    Monocytes Absolute 1.1 (*)    All other components within normal limits  BASIC METABOLIC PANEL - Abnormal; Notable for the following:    Glucose, Bld 139 (*)    BUN 27 (*)    Creatinine, Ser 1.54 (*)    Calcium 8.4 (*)    GFR calc non Af Amer 37 (*)    GFR calc Af Amer 43 (*)    All other components within normal limits  URINE CULTURE  CULTURE, BLOOD (ROUTINE X 2)  CULTURE, BLOOD (ROUTINE X 2)  URINE CULTURE  APTT  PROTIME-INR   ____________________________________________  EKG  None   ____________________________________________  RADIOLOGY  none ____________________________________________   PROCEDURES  Procedure(s) performed: None  Critical Care performed: No  ____________________________________________   INITIAL IMPRESSION / ASSESSMENT AND PLAN / ED COURSE  Pertinent labs & imaging results that were available during my care of the patient were reviewed by me and considered in my medical decision making (see chart for details).  Gabriel Valencia is a 81 y.o. male chronic dementia, indwelling Foley catheter for BPH, atrial fibrillation not chronically anticoagulated at this point who presents from living facility for blood from Foley catheter. Foley catheter was slightly dislodged on arrival and was likely not connecting with the bladder. It has been reinserted, is now draining pink tinged urine, has put out several 100 cc and the patient no longer has any abdominal tenderness. Very likely that traumatic dislodgment caused the hematuria but will check his basic labs, urinalysis and urine culture.  ----------------------------------------- 9:02 PM on  06/02/2014 -----------------------------------------  labs notable for new leukocytosis as well as acute renal failure with Cr 1.54 (baseline 0.77 on dc a few days ago). Recently treated with keflex for UTI and UA with gross purulence so will admit for IV abx. Recent cultures grew gram + rods and candida so IV ceftriaxone and fluconazole ordered. Hospitalist will admit. ____________________________________________   FINAL CLINICAL IMPRESSION(S) / ED DIAGNOSES  Final diagnoses:  Foley catheter problem, initial encounter  Homero FellersFrank hematuria  UTI (lower urinary tract infection)  Acute renal failure, unspecified acute renal failure type      Gayla DossEryka A Zadin Lange, MD 06/02/14 [phone]

## 2014-06-03 ENCOUNTER — Encounter: Payer: Self-pay | Admitting: General Practice

## 2014-06-03 DIAGNOSIS — F17210 Nicotine dependence, cigarettes, uncomplicated: Secondary | ICD-10-CM

## 2014-06-03 DIAGNOSIS — Z436 Encounter for attention to other artificial openings of urinary tract: Secondary | ICD-10-CM

## 2014-06-03 DIAGNOSIS — R310 Gross hematuria: Secondary | ICD-10-CM | POA: Insufficient documentation

## 2014-06-03 DIAGNOSIS — Z66 Do not resuscitate: Secondary | ICD-10-CM

## 2014-06-03 DIAGNOSIS — M1990 Unspecified osteoarthritis, unspecified site: Secondary | ICD-10-CM

## 2014-06-03 DIAGNOSIS — I4891 Unspecified atrial fibrillation: Secondary | ICD-10-CM

## 2014-06-03 DIAGNOSIS — Z79899 Other long term (current) drug therapy: Secondary | ICD-10-CM

## 2014-06-03 DIAGNOSIS — Z515 Encounter for palliative care: Secondary | ICD-10-CM

## 2014-06-03 DIAGNOSIS — N390 Urinary tract infection, site not specified: Secondary | ICD-10-CM

## 2014-06-03 DIAGNOSIS — I10 Essential (primary) hypertension: Secondary | ICD-10-CM

## 2014-06-03 DIAGNOSIS — G301 Alzheimer's disease with late onset: Secondary | ICD-10-CM

## 2014-06-03 DIAGNOSIS — F0280 Dementia in other diseases classified elsewhere without behavioral disturbance: Secondary | ICD-10-CM

## 2014-06-03 LAB — BASIC METABOLIC PANEL
Anion gap: 6 (ref 5–15)
BUN: 27 mg/dL — AB (ref 6–20)
CO2: 29 mmol/L (ref 22–32)
Calcium: 8.1 mg/dL — ABNORMAL LOW (ref 8.9–10.3)
Chloride: 107 mmol/L (ref 101–111)
Creatinine, Ser: 1.28 mg/dL — ABNORMAL HIGH (ref 0.61–1.24)
GFR calc Af Amer: 54 mL/min — ABNORMAL LOW (ref >60)
GFR, EST NON AFRICAN AMERICAN: 47 mL/min — AB (ref >60)
GLUCOSE: 98 mg/dL (ref 65–99)
Potassium: 3.9 mmol/L (ref 3.5–5.1)
SODIUM: 142 mmol/L (ref 135–145)

## 2014-06-03 LAB — CBC
HCT: 30.8 % — ABNORMAL LOW (ref 40.0–52.0)
Hemoglobin: 10.5 g/dL — ABNORMAL LOW (ref 13.0–18.0)
MCH: 31.9 pg (ref 26.0–34.0)
MCHC: 34 g/dL (ref 32.0–36.0)
MCV: 93.9 fL (ref 80.0–100.0)
Platelets: 145 10*3/uL — ABNORMAL LOW (ref 150–440)
RBC: 3.28 MIL/uL — ABNORMAL LOW (ref 4.40–5.90)
RDW: 15.5 % — ABNORMAL HIGH (ref 11.5–14.5)
WBC: 9.7 10*3/uL (ref 3.8–10.6)

## 2014-06-03 MED ORDER — CEFTRIAXONE SODIUM IN DEXTROSE 20 MG/ML IV SOLN
1.0000 g | INTRAVENOUS | Status: DC
  Administered 2014-06-03 – 2014-06-04 (×2): 1 g via INTRAVENOUS
  Filled 2014-06-03 (×2): qty 50

## 2014-06-03 NOTE — Progress Notes (Signed)
Initial Nutrition Assessment  DOCUMENTATION CODES:  Severe malnutrition in context of chronic illness  INTERVENTION: Medical Nutrition Supplement: Magic cup TID with meals Meals and Snacks: Cater to patient preferences  NUTRITION DIAGNOSIS:  Malnutrition related to chronic illness as evidenced by meal completion < 50%, severe depletion of muscle mass, severe depletion of body fat.  GOAL: Energy Intake: Patient will meet greater than or equal to 90% of their needs with meals and supplements.  MONITOR:  PO intake, Weight trends, Supplement acceptance, I & O's, Labs  REASON FOR ASSESSMENT:  Malnutrition Screening Tool    ASSESSMENT: Reason For Admission: admitted with hematuria and acute cystitis PMHx: HTN, Dementia Typical Fluid/ Food Intake: 40-50% of meals recorded per I/O Meal/ Snack Patterns: Unable to assess Supplements: Per previous recent medical records, patient does not like Ensure/ Boost supplements  Labs:  Electrolyte and Renal Profile:    Recent Labs Lab 06/02/14 1910 06/03/14 0556  BUN 27* 27*  CREATININE 1.54* 1.28*  NA 141 142  K 4.5 3.9   Meds: Zocor, NS @ 1075ml/ hr  UOP: 105900ml/ 24 hrs  Physical Findings: Nutrition-Focused physical exam completed. Findings are severe fat depletion, severe muscle depletion, and no edema.   Weight Changes: On review on previous hospital records, patient has lost 5% x 1 week.  Height:  Ht Readings from Last 1 Encounters:  06/02/14 5\' 7"  (1.702 m)    Weight:  Wt Readings from Last 1 Encounters:  06/02/14 137 lb 1.6 oz (62.188 kg)    Ideal Body Weight:     Wt Readings from Last 10 Encounters:  06/02/14 137 lb 1.6 oz (62.188 kg)  05/26/14 144 lb (65.318 kg)    BMI:  Body mass index is 21.47 kg/(m^2).  Estimated Nutritional Needs:  Kcal:  1477-1773 kcal/ day (BEE: 1231 x 1.2 AF x 1.0-1.2 IF)  Protein:  74-93 g Pro/day (1.2-1.4 g pro/ kg/ day)  Fluid:  1557-1869 ml/ day (25-30 ml/ kg)  Skin:   Reviewed, no issues  Diet Order:  Diet 2 gram sodium Room service appropriate?: Yes; Fluid consistency:: Thin  EDUCATION NEEDS:  No education needs identified at this time   Intake/Output Summary (Last 24 hours) at 06/03/14 1034 Last data filed at 06/03/14 0600  Gross per 24 hour  Intake    600 ml  Output   1000 ml  Net   -400 ml    Last BM:  None recorded  Joeseph Amorracey L. Gaines, RDN Pager: [phone] Office: [phone]  HIGH Care Level

## 2014-06-03 NOTE — Consult Note (Signed)
Palliative Medicine Inpatient Consult Note   Name: Gabriel Valencia Date: 06/03/2014 MRN: [id number]  DOB: [birth year]  Referring Physician: Houston SirenVivek J Sainani, MD  Palliative Care consult requested for this 81 y.o. male for goals of medical therapy in patient with Alzheimer's Dementia, BPH with chronic foley catheter, recurrent UTI hx, h/o hematuria, and a-fib.   [facility].  ER workup significant for hematuria and UTI.  Pt admitted to 1A for evaluation and treatment.  Pt currently resting in bed and in NAD. Family not at bedside.  Foley catheter with dark colored urine draining.   REVIEW OF SYSTEMS:  Pain: None Dyspnea:  No Nausea/Vomiting:  No All other systems were reviewed and found to be negative    SOCIAL HISTORY:  reports that he has been smoking.  He does not have any smokeless tobacco history on file. He reports that he does not drink alcohol or use illicit drugs. Pt is married and [facility].   CODE STATUS: DNR  PAST MEDICAL HISTORY: Past Medical History  Diagnosis Date  . Hypertension   . A-fib   . Dementia   . Arthritis   . Cataract     PAST SURGICAL HISTORY:  Past Surgical History  Procedure Laterality Date  . Cholecystectomy      ALLERGIES:  has No Known Allergies.  MEDICATIONS:  Current Facility-Administered Medications  Medication Dose Route Frequency Provider Last Rate Last Dose  . 0.9 %  sodium chloride infusion   Intravenous Continuous Shaune PollackQing Chen, MD      . acetaminophen (TYLENOL) tablet 650 mg  650 mg Oral Q6H PRN Shaune PollackQing Chen, MD       Or  . acetaminophen (TYLENOL) suppository 650 mg  650 mg Rectal Q6H PRN Shaune PollackQing Chen, MD      . bisacodyl (DULCOLAX) EC tablet 5 mg  5 mg Oral Daily PRN Shaune PollackQing Chen, MD      . digoxin Margit Banda(LANOXIN) tablet 0.125 mg  0.125 mg Oral Daily Shaune PollackQing Chen, MD   0.125 mg at 06/03/14 0910  . haloperidol (HALDOL) tablet 5 mg  5 mg Oral TID PRN Shaune PollackQing Chen, MD      . isosorbide mononitrate  (IMDUR) 24 hr tablet 120 mg  120 mg Oral Daily Shaune PollackQing Chen, MD   120 mg at 06/03/14 0910  . levothyroxine (SYNTHROID, LEVOTHROID) tablet 50 mcg  50 mcg Oral QAC breakfast Shaune PollackQing Chen, MD   50 mcg at 06/03/14 [phone]  . loratadine (CLARITIN) tablet 10 mg  10 mg Oral Daily Shaune PollackQing Chen, MD   10 mg at 06/03/14 0910  . magnesium hydroxide (MILK OF MAGNESIA) suspension 30 mL  30 mL Oral Daily PRN Shaune PollackQing Chen, MD      . metoprolol (LOPRESSOR) tablet 50 mg  50 mg Oral BID Shaune PollackQing Chen, MD   50 mg at 06/03/14 0910  . nitroGLYCERIN (NITROSTAT) SL tablet 0.4 mg  0.4 mg Sublingual Q5 min PRN Shaune PollackQing Chen, MD      . ondansetron [facility](ZOFRAN) tablet 4 mg  4 mg Oral Q6H PRN Shaune PollackQing Chen, MD       Or  . ondansetron [facility](ZOFRAN) injection 4 mg  4 mg Intravenous Q6H PRN Shaune PollackQing Chen, MD      . oxybutynin (DITROPAN) tablet 5 mg  5 mg Oral TID Shaune PollackQing Chen, MD   5 mg at 06/03/14 0910  . pantoprazole (PROTONIX) EC tablet 40 mg  40 mg Oral Daily Shaune PollackQing Chen, MD   40 mg  at 06/03/14 0910  . QUEtiapine (SEROQUEL) tablet 25 mg  25 mg Oral q morning - 10a Shaune PollackQing Chen, MD   25 mg at 06/03/14 0910   And  . QUEtiapine (SEROQUEL) tablet 50 mg  50 mg Oral QHS Shaune PollackQing Chen, MD   50 mg at 06/03/14 0013  . simvastatin (ZOCOR) tablet 40 mg  40 mg Oral QHS Shaune PollackQing Chen, MD   40 mg at 06/03/14 0012    Vital Signs: BP 113/66 mmHg  Pulse 64  Temp(Src) 98.2 F (36.8 C) (Oral)  Resp 18  Ht 5\' 7"  (1.702 m)  Wt 62.188 kg (137 lb 1.6 oz)  BMI 21.47 kg/m2  SpO2 98% Filed Weights   06/02/14 2309  Weight: 62.188 kg (137 lb 1.6 oz)    Estimated body mass index is 21.47 kg/(m^2) as calculated from the following:   Height as of this encounter: 5\' 7"  (1.702 m).   Weight as of this encounter: 62.188 kg (137 lb 1.6 oz).  PERFORMANCE STATUS (ECOG) : 2 - Symptomatic, <50% confined to bed  PHYSICAL EXAM: General appearance: alert, cooperative and disheveled Head: Normocephalic, without obvious abnormality, atraumatic Neck: supple, symmetrical, trachea midline and thyroid not  enlarged, symmetric, no tenderness/mass/nodules Resp: clear to auscultation bilaterally Cardio: regular rate and rhythm, S1, S2 normal, no murmur, click, rub or gallop GI: soft, non-tender; bowel sounds normal; no masses,  no organomegaly Genitalia: foley catheter present, no trauma noted Extremities: extremities normal, atraumatic, no cyanosis or edema Neurologic: Grossly normal  LABS: CBC:    Component Value Date/Time   WBC 9.7 06/03/2014 0556   WBC 5.6 05/18/2014 1950   HGB 10.5* 06/03/2014 0556   HGB 12.3* 05/18/2014 1950   HCT 30.8* 06/03/2014 0556   HCT 36.9* 05/18/2014 1950   PLT 145* 06/03/2014 0556   PLT 106* 05/18/2014 1950   MCV 93.9 06/03/2014 0556   MCV 93 05/18/2014 1950   NEUTROABS 10.4* 06/02/2014 1910   NEUTROABS 4.1 05/18/2014 1950   LYMPHSABS 0.6* 06/02/2014 1910   LYMPHSABS 0.9* 05/18/2014 1950   MONOABS 1.1* 06/02/2014 1910   MONOABS 0.5 05/18/2014 1950   EOSABS 0.0 06/02/2014 1910   EOSABS 0.1 05/18/2014 1950   BASOSABS 0.0 06/02/2014 1910   BASOSABS 0.0 05/18/2014 1950   Comprehensive Metabolic Panel:    Component Value Date/Time   NA 142 06/03/2014 0556   NA 139 05/18/2014 1950   K 3.9 06/03/2014 0556   K 4.9 05/18/2014 1950   CL 107 06/03/2014 0556   CL 104 05/18/2014 1950   CO2 29 06/03/2014 0556   CO2 32 05/18/2014 1950   BUN 27* 06/03/2014 0556   BUN 29* 05/18/2014 1950   CREATININE 1.28* 06/03/2014 0556   CREATININE 0.96 05/18/2014 1950   GLUCOSE 98 06/03/2014 0556   GLUCOSE 111* 05/18/2014 1950   CALCIUM 8.1* 06/03/2014 0556   CALCIUM 8.2* 05/18/2014 1950   AST 47* 05/18/2014 1950   ALT 15* 05/18/2014 1950   ALKPHOS 58 05/18/2014 1950   PROT 5.5* 05/18/2014 1950   ALBUMIN 3.1* 05/18/2014 1950    IMPRESSION:  Palliative Care consult requested for this 81 y.o. male for goals of medical therapy in patient with Alzheimer's Dementia, BPH with chronic foley catheter, recurrent UTI hx, h/o hematuria, and a-fib.   Pt presented to ER  [facility] for [facility]uria.  ER workup significant for hematuria and UTI.  Pt admitted to 1A for evaluation and treatment.  Pt resting in bed and pleasantly confused.  Pt with  recurrent hematuria and UTI this hospitalization.  Pt seen by Palliative medicine last hospitalization and plan was for pt to return to [facility] with increased home health services.  At that time pt was not a hospice candidate. I will attempt to reach pt's wife again to have goals of care conversation.  Pt wife was present last hospitalization and an in person conversation will be more fruitful in my opinion.  PLAN: 1. DNR 2. Goals of care conversation when able with pt's wife     More than 50% of the visit was spent in counseling/coordination of care: Yes  Time Spent: 

## 2014-06-03 NOTE — Clinical Social Work Note (Signed)
Clinical Social Work Assessment  Patient Details  Name: Gabriel Valencia MRN: [id number] Date of Birth: [birth year]  Date of referral:  06/03/14               Reason for consult:  Facility Placement                Permission sought to share information with:  Chartered certified accountant granted to share information::  Yes, Verbal Permission Granted  Name::      Amy  Agency::   [facility]  Relationship::   IT trainer Information:     Housing/Transportation Living arrangements for the past 2 months:  [place] of Information:  Spouse Patient Interpreter Needed:  None Criminal Activity/Legal Involvement Pertinent to Current Situation/Hospitalization:  No - Comment as needed Significant Relationships:  Spouse Lives with:  Facility Resident Do you feel safe going back to the place where you live?  Yes Need for family participation in patient care:  Yes (Comment)  Care giving concerns: Patient is a current resident and [facility].    Social Worker assessment / plan: Holiday representative (CSW) met with patient and his wife Hoyle Sauer. Patient was asleep and wife as sitting at bedside. CSW is familiar with patient and wife from admission last week on 1C. Wife reported that patient has been in and out of the hospital several times within the past 4 weeks. Per wife patient was here last week due to a UTI. Patient is here today due to a bloody catheter bag. Wife reported that the ED physician changed that Foley and reported that patient would need a few days if IV antibiotics for the UTI. Wife reported that patient has been a resident at [facility] since March 18th, 2016. Wife is agreeable to patient returning to [place].  CSW contacted Oceanographer at [facility] and made her aware of above. Per Amy patient will only require an assessment if he is stays at the hospital for a while and does not get up and walk. Per Amy they can't  do IV antibiotics at [facility]. CSW also spoke with Amy with patient's [employer]. Per [place] patient's PCP is Dr. Doy Hutching.   CSW will contiue to follow and assist as needed.   Blima Rich, LCSWA [phone]   Employment status:  Retired Nurse, adult PT Recommendations:  Not assessed at this time Information / Referral to community resources:  Other (Comment Required) ([place] )  Patient/Family's Response to care: Patient's Wife Hoyle Sauer is agreeable to patient returning to [facility].    Emotional Assessment Appearance:  Appears stated age Attitude/Demeanor/Rapport:    Affect (typically observed):  Other ([place] was sleeping during assessment ) Orientation:  Fluctuating Orientation (Suspected and/or reported Sundowners) Alcohol / Substance use:  Not Applicable Psych involvement (Current and /or in the community):  No (Comment)  Discharge Needs  Concerns to be addressed:  Discharge Planning Concerns Readmission within the last 30 days:  Yes Current discharge risk:  Chronically ill, Cognitively Impaired Barriers to Discharge:  Continued Medical Work up   Loralyn Freshwater, LCSW 06/03/2014, 1:41 PM

## 2014-06-03 NOTE — Progress Notes (Signed)
[facility] admitted from ED with acute cystitis. Chronic foley in place draining malodorous, red/brown, cloudy urine. Receiving IV ABX with no adverse effects, afebrile. Pleasantly confused, disoriented to place, time and situation. Appears comfortable and without pain, slept well through the night since admission. Nursing continues to monitor and assist with ADLs.

## 2014-06-03 NOTE — Progress Notes (Signed)
[facility] [place] [facility]   PATIENT NAME: Gabriel Valencia    MR#:  [id number]  DATE OF BIRTH:  [birth year]  SUBJECTIVE:  CHIEF COMPLAINT:   Chief Complaint  Patient presents with  . Hematuria    REVIEW OF SYSTEMS:    Review of Systems  Unable to perform ROS due to dementia, encephalopathy.   Nutrition: Regular Tolerating Diet: Very little.   DRUG ALLERGIES:  No Known Allergies  VITALS:  Blood pressure 113/66, pulse 64, temperature 98.2 F (36.8 C), temperature source Oral, resp. rate 18, height 5\' 7"  (1.702 m), weight 62.188 kg (137 lb 1.6 oz), SpO2 98 %.  PHYSICAL EXAMINATION:   Physical Exam  GENERAL:  81 y.o.-year-old patient lying in the bed with no acute distress.  EYES: Pupils equal, round, reactive to light and accommodation. No scleral icterus. Extraocular muscles intact.  HEENT: Head atraumatic, normocephalic. Oropharynx and nasopharynx clear.  NECK:  Supple, no jugular venous distention. No thyroid enlargement, no tenderness.  LUNGS: Normal breath sounds bilaterally, no wheezing, rales,rhonchi. No use of accessory muscles of respiration.  CARDIOVASCULAR: S1, S2 normal. No murmurs, rubs, or gallops.  ABDOMEN: Soft, nontender, nondistended. Bowel sounds present. No organomegaly or mass.  EXTREMITIES: No cyanosis, clubbing or edema b/l.    NEUROLOGIC: Cranial nerves II through XII are intact. No focal Motor or sensory deficits b/l.   PSYCHIATRIC: The patient is alert and oriented x 3.  SKIN: No obvious rash, lesion, or ulcer.    LABORATORY PANEL:   CBC  Recent Labs Lab 06/03/14 0556  WBC 9.7  HGB 10.5*  HCT 30.8*  PLT 145*   ------------------------------------------------------------------------------------------------------------------  Chemistries   Recent Labs Lab 06/03/14 0556  NA 142  K 3.9  CL 107  CO2 29  GLUCOSE 98  BUN 27*  CREATININE 1.28*  CALCIUM 8.1*    ------------------------------------------------------------------------------------------------------------------  Cardiac Enzymes No results for input(s): TROPONINI in the last 168 hours. ------------------------------------------------------------------------------------------------------------------  RADIOLOGY:  No results found.   ASSESSMENT AND PLAN:   81 yo male w/ hx of dementia, chronic indwelling foley due to BPH, hx of recent hematuria/UTI, HTN, came into hospital due to hematuria and noted to have a UTI.   * Hematuria - s/p chronic indwelling foley which was recently changed during last hospitalization.  - improved now and will monitor.  - Hg. Stable. Cont. Treatment for underlying UTI.   *  UTI - UA was abnormal but pt does have chronic indwelling foley.  - afebrile, WBC count normal.  Cont. IV Ceftriaxone and d/c zosyn.  - follow urine cultures.   * Hypothyroidism - cont. Synthroid.   * HtN - cont. Imdur, Metoprolol.   * Dementia w/ Agitation - cont Haldol PRN - cont. Seroquel. - avoid benzo's.   * Hyperlipidemia - cont. Simvastatin  Pt. Belongs to Dr. Judithann SheenSparks and discussed with him this a.m. And will transfer to his service for tomorrow a.m.   Palliative Care consult to discuss goals of care.    All the records are reviewed and case discussed with Care Management/Social Workerr. Management plans discussed with the patient, family and they are in agreement.  CODE STATUS: DNR  DVT Prophylaxis: TED & SCD  TOTAL TIME TAKING CARE OF THIS PATIENT: 30 minutes.   POSSIBLE D/C IN 1-2 DAYS, DEPENDING ON CLINICAL CONDITION.   Houston SirenSAINANI,Gabriel Valencia J M.D on 06/03/2014 at 2:49 PM  Between 7am to 6pm - Pager - [phone]  After 6pm go to [email or web address] - password [email or web address]  [facility]  [facility]  Office  [phone]  CC: Primary care physician; No PCP Per Patient

## 2014-06-04 ENCOUNTER — Encounter
Admission: EM | Disposition: A | Discharge status: Other Acute Inpatient Hospital | Payer: Self-pay | Source: Home / Self Care | Attending: Internal Medicine

## 2014-06-04 SURGERY — OPEN REDUCTION INTERNAL FIXATION (ORIF) PATELLA
Anesthesia: Choice | Laterality: Left

## 2014-06-04 NOTE — Evaluation (Signed)
Physical Therapy Evaluation Patient Details Name: Gabriel Valencia MRN: [id number] DOB: 08-26-1919 Today's Date: 06/04/2014   History of Present Illness  [facility] secondary to gross hematuria; admitted with UTI, renal failure.  Catheter noted have been dislodged; replaced by ER physician.  Clinical Impression  Upon evaluation, patient alert and oriented to self, general location only.  Pleasant and cooperative with all evaluation components.  Bilat UE/LE strength and ROM grossly WFL and symmetrical; functional for basic transfers and mobility.  Very motor restless and fidgeting.  Currently requires min assist for bed mobility; cga/min assist for sit/stand, basic transfers and gait (100') with RW.  Min assist during turn management to prevent L lateral LOB.  Patient with limited insight into balance deficits. Would benefit from skilled PT to address above deficits and promote optimal return to PLOF;Recommend transition to HHPT upon discharge from acute hospitalization. Patient/family aware of recommendations and in agreement with plan.     Follow Up Recommendations Home health PT (home safety assessment)    Equipment Recommendations       Recommendations for Other Services       Precautions / Restrictions Precautions Precautions: Fall Restrictions Weight Bearing Restrictions: No      Mobility  Bed Mobility Overal bed mobility: Needs Assistance Bed Mobility: Supine to Sit;Sit to Supine     Supine to sit: Min assist Sit to supine: Min assist      Transfers Overall transfer level: Needs assistance Equipment used: Rolling walker (2 wheeled) Transfers: Sit to/from Stand Sit to Stand: Min assist            Ambulation/Gait Ambulation/Gait assistance: Min assist Ambulation Distance (Feet): 100 Feet Assistive device: Rolling walker (2 wheeled)     Gait velocity interpretation: <1.8 ft/sec, indicative of risk for recurrent falls General Gait  Details: reciprocal stepping with decreased step height/length bilat, R > L; mildly excessive weight shift to L throughout gait cycle.  Transitions to very short, shuffling steps with turn management; min assist to prevent L lateral LOB  Stairs            Wheelchair Mobility    Modified Rankin (Stroke Patients Only)       Balance Overall balance assessment: Needs assistance Sitting-balance support: No upper extremity supported Sitting balance-Leahy Scale: Good     Standing balance support: Bilateral upper extremity supported Standing balance-Leahy Scale: Fair                               Pertinent Vitals/Pain Pain Assessment: No/denies pain (no clinical indicators of pain)    Home Living Family/patient expects to be discharged to:: Assisted living               Home Equipment: Walker - 2 wheels      Prior Function Level of Independence: Needs assistance   Gait / Transfers Assistance Needed: ambulatory for short-distances within ALF with RW, sup per wife  ADL's / Homemaking Assistance Needed: assist from staff for toileting, ADLs  Comments: ambulates to/from dining hall for meals (approx 10-15' per wife)     Hand Dominance        Extremity/Trunk Assessment   Upper Extremity Assessment: Overall WFL for tasks assessed           Lower Extremity Assessment: Overall WFL for tasks assessed (grossly at least 4/5)      Cervical / Trunk Assessment:  (forward head, rounded shoulders)  Communication  Communication: No difficulties  Cognition Arousal/Alertness: Awake/alert Behavior During Therapy: WFL for tasks assessed/performed Overall Cognitive Status: History of cognitive impairments - at baseline (oriented to self, general location; follows simple, one-step commands)       Memory: Decreased short-term memory              General Comments      Exercises        Assessment/Plan    PT Assessment Patient needs continued  PT services  PT Diagnosis Difficulty walking;Generalized weakness   PT Problem List Decreased strength;Decreased range of motion;Decreased activity tolerance;Decreased mobility;Decreased balance;Decreased safety awareness;Decreased knowledge of precautions  PT Treatment Interventions DME instruction;Gait training;Patient/family education;Functional mobility training;Therapeutic activities;Therapeutic exercise;Balance training   PT Goals (Current goals can be found in the Care Plan section) Acute Rehab PT Goals Patient Stated Goal: patient unable to verbalize; per wife, to return to ALF PT Goal Formulation: With patient Time For Goal Achievement: 06/18/14 Potential to Achieve Goals: Fair Additional Goals Additional Goal #1: Patient to perform all basic transfers with RW, sup, for access to all seating surfaces in home environment. Additional Goal #2: Patient to mobilize 50>150' with RW, sup, for household mobilization upon discharge.    Frequency Min 2X/week   Barriers to discharge        Co-evaluation               End of Session Equipment Utilized During Treatment: Gait belt Activity Tolerance: Patient tolerated treatment well Patient left: in bed;with call bell/phone within reach;with family/visitor present           Time: [id number] PT Time Calculation (min) (ACUTE ONLY): 29 min   Charges:   PT Evaluation $Initial PT Evaluation Tier I: 1 Procedure     PT G Codes:       Danee Soller H. Manson PasseyBrown, PT, DPT 06/04/2014, 2:11 PM [phone]

## 2014-06-04 NOTE — Progress Notes (Signed)
Gabriel Valencia remained safely in bed this shift, remains disoriented r/t dementia, repositioned, agitation controlled with medication. Appeared comfortable with no signs of pain, and slept well most of the night. Continues IV ABX with no adverse effects, afebrile. Output clearing up. Nursing continues to monitor and assist with ADLs.

## 2014-06-04 NOTE — Progress Notes (Signed)
1900-2300 patient alert but confused. Denies any pain. Free from falls and injury during this shift. Foley patent and draining clear yellow urine.

## 2014-06-04 NOTE — Progress Notes (Signed)
Gabriel Valencia is a 81 y.o. male  hematuria  SUBJECTIVE:  Pt in NAD. Urine clearing. Some suprapubic pain. Afebrile. Remains confused.  ______________________________________________________________________  ROS: Review of systems is unremarkable for any active cardiac,respiratory, GI, GU, hematologic, neurologic or psychiatric systems, 10 systems reviewed.  @CMEDLIST @  Past Medical History  Diagnosis Date  . Hypertension   . A-fib   . Dementia   . Arthritis   . Cataract     Past Surgical History  Procedure Laterality Date  . Cholecystectomy      PHYSICAL EXAM:  BP 121/68 mmHg  Pulse 64  Temp(Src) 97.4 F (36.3 C) (Oral)  Resp 18  Ht 5\' 7"  (1.702 m)  Wt 62.188 kg (137 lb 1.6 oz)  BMI 21.47 kg/m2  SpO2 99%  Wt Readings from Last 3 Encounters:  06/02/14 62.188 kg (137 lb 1.6 oz)  05/26/14 65.318 kg (144 lb)            Constitutional: NAD Neck: supple, no thyromegaly Respiratory: CTA, no rales or wheezes Cardiovascular: irregularly irregular, no murmur, no gallop Abdomen: soft, good BS, nontender Extremities: no edema Neuro: alert and oriented, no focal motor or sensory deficits  ASSESSMENT/PLAN:  Labs and imaging studies were reviewed  Urology consult today. Continue IV fluids and IV ABX for now. Evaluate for Hospice services due to DNR status and recurrent admissions. F/u labs in AM. Hope to D/C back to facility tomorrow.

## 2014-06-04 NOTE — Care Management (Signed)
RNCM consult obtained for hospice services. Per Palliative patient is not appropriate at this time for hospice and goal would be to treat patient in his home setting (ALF). Palliative also recommended higher level of care such as long-term at a skilled nursing facility. MD notified via text message that PT would be beneficial. [facility] following currently- notified of Medicaid per wife to hopefully assist with co-pays (25$ per visit). I've also requested [facility] to make this patient HRI patient due to recurrent admissions.

## 2014-06-04 NOTE — Care Management (Signed)
Per CSW plan per patient's wife if for patient to return to ALF followed by [facility]. Co-pays $0.00 with Medicaid. HRI approved by Dr. Judithann SheenSparks for increased RN visits in home setting.

## 2014-06-04 NOTE — Progress Notes (Addendum)
Pleasantly confused. U/o via foley wnl. Tolerated diet . Denies any pain . Often found pulling at foley cath. Afebrile .vss. Plan discharge back to [facility] tomorrow

## 2014-06-04 NOTE — Progress Notes (Signed)
Urology Consult Follow Up  Subjective: [facility] after traumatic Foley removal, hematuria, and UTI. He was initially noted to have some bleeding around the catheter which resolved fairly quickly after the catheters are placed. UA was suspicious for UTI, urine culture growing GNR.  He is currently on IV antibiotics.  Wife at bedside. He has not followed up with his [facility] urologist Dr. Lissa HoardMacDairmid has an appointment scheduled with him in a few weeks.  Hemoglobin relatively stable from last admission.  Current and trending down with replacement of the catheter and treatment of UTI.  Anti-infectives: Anti-infectives    Start     Dose/Rate Route Frequency Ordered Stop   06/03/14 1800  cefTRIAXone (ROCEPHIN) 1 g in dextrose 5 % 50 mL IVPB - Premix     1 g 100 mL/hr over 30 Minutes Intravenous Every 24 hours 06/03/14 1541     06/02/14 2300  piperacillin-tazobactam (ZOSYN) IVPB 3.375 g  Status:  Discontinued     3.375 g 12.5 mL/hr over 240 Minutes Intravenous 3 times per day 06/02/14 2223 06/03/14 1449   06/02/14 2115  cefTRIAXone (ROCEPHIN) 1 g in dextrose 5 % 50 mL IVPB     1 g 100 mL/hr over 30 Minutes Intravenous  Once 06/02/14 2102 06/02/14 2157   06/02/14 2115  fluconazole (DIFLUCAN) IVPB 200 mg     200 mg 100 mL/hr over 60 Minutes Intravenous  Once 06/02/14 2102 06/02/14 2259      Current Facility-Administered Medications  Medication Dose Route Frequency Provider Last Rate Last Dose  . 0.9 %  sodium chloride infusion   Intravenous Continuous Shaune PollackQing Chen, MD 75 mL/hr at 06/04/14 0947    . acetaminophen (TYLENOL) tablet 650 mg  650 mg Oral Q6H PRN Shaune PollackQing Chen, MD   650 mg at 06/04/14 1025   Or  . acetaminophen (TYLENOL) suppository 650 mg  650 mg Rectal Q6H PRN Shaune PollackQing Chen, MD      . bisacodyl (DULCOLAX) EC tablet 5 mg  5 mg Oral Daily PRN Shaune PollackQing Chen, MD   5 mg at 06/04/14 1720  . cefTRIAXone (ROCEPHIN) 1 g in dextrose 5 % 50 mL IVPB - Premix  1 g  Intravenous Q24H Houston SirenVivek J Sainani, MD   1 g at 06/03/14 2000  . digoxin (LANOXIN) tablet 0.125 mg  0.125 mg Oral Daily Shaune PollackQing Chen, MD   0.125 mg at 06/04/14 1025  . haloperidol (HALDOL) tablet 5 mg  5 mg Oral TID PRN Shaune PollackQing Chen, MD   5 mg at 06/03/14 2125  . isosorbide mononitrate (IMDUR) 24 hr tablet 120 mg  120 mg Oral Daily Shaune PollackQing Chen, MD   120 mg at 06/04/14 1025  . levothyroxine (SYNTHROID, LEVOTHROID) tablet 50 mcg  50 mcg Oral QAC breakfast Shaune PollackQing Chen, MD   50 mcg at 06/04/14 1025  . loratadine (CLARITIN) tablet 10 mg  10 mg Oral Daily Shaune PollackQing Chen, MD   10 mg at 06/04/14 1025  . magnesium hydroxide (MILK OF MAGNESIA) suspension 30 mL  30 mL Oral Daily PRN Shaune PollackQing Chen, MD      . metoprolol (LOPRESSOR) tablet 50 mg  50 mg Oral BID Shaune PollackQing Chen, MD   50 mg at 06/04/14 1025  . nitroGLYCERIN (NITROSTAT) SL tablet 0.4 mg  0.4 mg Sublingual Q5 min PRN Shaune PollackQing Chen, MD      . ondansetron [facility](ZOFRAN) tablet 4 mg  4 mg Oral Q6H PRN Shaune PollackQing Chen, MD       Or  .  ondansetron (ZOFRAN) injection 4 mg  4 mg Intravenous Q6H PRN Shaune PollackQing Chen, MD      . oxybutynin (DITROPAN) tablet 5 mg  5 mg Oral TID Shaune PollackQing Chen, MD   5 mg at 06/04/14 1720  . pantoprazole (PROTONIX) EC tablet 40 mg  40 mg Oral Daily Shaune PollackQing Chen, MD   40 mg at 06/04/14 1025  . QUEtiapine (SEROQUEL) tablet 25 mg  25 mg Oral q morning - 10a Shaune PollackQing Chen, MD   25 mg at 06/04/14 1310   And  . QUEtiapine (SEROQUEL) tablet 50 mg  50 mg Oral QHS Shaune PollackQing Chen, MD   50 mg at 06/03/14 2125  . simvastatin (ZOCOR) tablet 40 mg  40 mg Oral QHS Shaune PollackQing Chen, MD   40 mg at 06/03/14 2125     Objective: Vital signs in last 24 hours: Temp:  [97.4 F (36.3 C)-98.1 F (36.7 C)] 97.9 F (36.6 C) (05/12 1545) Pulse Rate:  [62-123] 62 (05/12 1545) Resp:  [17-18] 18 (05/12 1240) BP: (91-136)/(49-92) 103/64 mmHg (05/12 1545) SpO2:  [94 %-100 %] 100 % (05/12 1545)  Intake/Output from previous day: 05/11 0701 - 05/12 0700 In: 1713.8 [P.O.:840; I.V.:823.8; IV Piggyback:50] Out: 1500  [Urine:1500] Intake/Output this shift:     Physical Exam  Constitutional: He is well-developed, well-nourished, and in no distress.  Alert but not oriented.  Abdominal: Soft. He exhibits no distension.  Genitourinary:  Foley catheter in place, 4118 French draining clear yellow urine. Penis with slightlyedematous foreskin, ventral urethral erosion noted to the level of coronal margin.  Musculoskeletal: He exhibits no edema.  Skin: Skin is warm and dry.    Lab Results:   Recent Labs  06/02/14 1910 06/03/14 0556  WBC 12.0* 9.7  HGB 11.5* 10.5*  HCT 34.8* 30.8*  PLT 158 145*   BMET  Recent Labs  06/02/14 1910 06/03/14 0556  NA 141 142  K 4.5 3.9  CL 106 107  CO2 27 29  GLUCOSE 139* 98  BUN 27* 27*  CREATININE 1.54* 1.28*  CALCIUM 8.4* 8.1*   PT/INR  Recent Labs  06/02/14 1910  LABPROT 14.8  INR 1.14   ABG No results for input(s): PHART, HCO3 in the last 72 hours.  Invalid input(s): PCO2, PO2  Studies/Results: No results found.   Assessment:  1. Traumatic Foley removal 2. Hematuria related to #1, resolved, no longer on anticoagulation 3.UTI on IV abx 4. Urethral erosion (iatrogenic hypospadias) related to chronic catheter   Discussed options today with this wife including placement if SPT by IR under local/ sedation with pigtail catheter.  This may reduce the number of admissions related to catheter trauma as patient has habit for self removal.    She will think about this option and discuss if further with his local Urologist.  Recommend continuation of abx for UTI, adjust based on urine culture data.  Case also discussed with Dr. Judithann SheenSparks and Tonie GriffithStephen Mantzouris.     Urology to sign off as no need for further intervention   LOS: 2 days    Gabriel ScotlandBRANDON, Gabriel Valencia 06/04/2014

## 2014-06-04 NOTE — Progress Notes (Signed)
Clinical Social Worker (CSW) met with patient and his wife Hoyle Sauer at bedside. Wife reported that she would like to give [facility] 1 more chance. If patient comes back to the hospital too soon then she will consider a higher level of care. Per Dr. Doy Hutching patient will D/C back to [facility] tomorrow. CSW contacted Amy Director of [employer] and made her aware of above. Per [facility] does not need to complete and in person assessment before patient returns. CSW made Amy aware that patient is going to return with a MOST form and [place] with [place] per RN Case Manager. Per Amy she will be out of the office tomorrow and Med Tech Butch Penny or Helene Kelp can be contacted at [facility]. CSW will continue to follow and assist as needed.   Blima Rich, [place] [phone]

## 2014-06-04 NOTE — Consult Note (Signed)
Palliative Medicine Inpatient Consult Follow Up Note   Name: Gabriel Valencia Date: 06/04/2014 MRN: [id number]  DOB: 20-Mar-1919  Referring Physician: Marguarite ArbourJeffrey D Sparks, MD  Palliative Care consult requested for this 81 y.o. male for goals of medical therapy in patient with Alzheimer's Dementia, BPH with chronic foley catheter, recurrent UTI hx, h/o hematuria, and a-fib. [facility] for [facility]. ER workup significant for hematuria and UTI. Pt admitted to 1A for evaluation and treatment. Pt currently resting in bed comfortably and wife is at bedside. Marland Kitchen.    REVIEW OF SYSTEMS:  Pain: None Dyspnea:  No Nausea/Vomiting:  No All other systems were reviewed and found to be negative  CODE STATUS: DNR   PAST MEDICAL HISTORY: Past Medical History  Diagnosis Date  . Hypertension   . A-fib   . Dementia   . Arthritis   . Cataract     PAST SURGICAL HISTORY:  Past Surgical History  Procedure Laterality Date  . Cholecystectomy      Vital Signs: BP 136/92 mmHg  Pulse 70  Temp(Src) 98 F (36.7 C) (Oral)  Resp 17  Ht 5\' 7"  (1.702 m)  Wt 62.188 kg (137 lb 1.6 oz)  BMI 21.47 kg/m2  SpO2 97% Filed Weights   06/02/14 2309  Weight: 62.188 kg (137 lb 1.6 oz)    Estimated body mass index is 21.47 kg/(m^2) as calculated from the following:   Height as of this encounter: 5\' 7"  (1.702 m).   Weight as of this encounter: 62.188 kg (137 lb 1.6 oz).  PHYSICAL EXAM: [place]: NAD HEENT: OP clear, moist oral mucosa Neck: Trachea midline  Cardiovascular: RRR Pulmonary/Chest: Clear ant fields Abdominal: Soft, NTTP. Hypoactive bowel sounds GU: No SP tenderness, foley catheter present, dark colored urine Extremities: No edema  Neurological: Grossly nonfocal Skin: Warm, dry and intact.  Psychiatric: Calm, A&O x person  LABS: CBC:    Component Value Date/Time   WBC 9.7 06/03/2014 0556   WBC 5.6 05/18/2014 1950   HGB 10.5* 06/03/2014 0556   HGB 12.3*  05/18/2014 1950   HCT 30.8* 06/03/2014 0556   HCT 36.9* 05/18/2014 1950   PLT 145* 06/03/2014 0556   PLT 106* 05/18/2014 1950   MCV 93.9 06/03/2014 0556   MCV 93 05/18/2014 1950   NEUTROABS 10.4* 06/02/2014 1910   NEUTROABS 4.1 05/18/2014 1950   LYMPHSABS 0.6* 06/02/2014 1910   LYMPHSABS 0.9* 05/18/2014 1950   MONOABS 1.1* 06/02/2014 1910   MONOABS 0.5 05/18/2014 1950   EOSABS 0.0 06/02/2014 1910   EOSABS 0.1 05/18/2014 1950   BASOSABS 0.0 06/02/2014 1910   BASOSABS 0.0 05/18/2014 1950   Comprehensive Metabolic Panel:    Component Value Date/Time   NA 142 06/03/2014 0556   NA 139 05/18/2014 1950   K 3.9 06/03/2014 0556   K 4.9 05/18/2014 1950   CL 107 06/03/2014 0556   CL 104 05/18/2014 1950   CO2 29 06/03/2014 0556   CO2 32 05/18/2014 1950   BUN 27* 06/03/2014 0556   BUN 29* 05/18/2014 1950   CREATININE 1.28* 06/03/2014 0556   CREATININE 0.96 05/18/2014 1950   GLUCOSE 98 06/03/2014 0556   GLUCOSE 111* 05/18/2014 1950   CALCIUM 8.1* 06/03/2014 0556   CALCIUM 8.2* 05/18/2014 1950   AST 47* 05/18/2014 1950   ALT 15* 05/18/2014 1950   ALKPHOS 58 05/18/2014 1950   PROT 5.5* 05/18/2014 1950   ALBUMIN 3.1* 05/18/2014 1950    IMPRESSION: Palliative Care consult requested for this  81 y.o. male for goals of medical therapy in patient with Alzheimer's Dementia, BPH with chronic foley catheter, recurrent UTI hx, h/o hematuria, and a-fib. Pt presented [facility] ER [facility] for hematuria. ER workup significant for hematuria and UTI. Pt admitted to 1A for evaluation and treatment.   Pt currently resting in bed comfortably and wife is at bedside.  Wife agrees to goals of care conversation.  Wife confirms no acute decline since last hospitalization and in my opinion does not qualify for hospice services at this time.  Wife is unsure why patient was sent  To hospital this time.  Spoke with wife about a MOST and wife agrees this will help and this was filled out during visit  stating do not re-hospitalize.  Wife agrees that previous plan to have increased HH services is what she wants.   CM and SW updated  PLAN: 1. MOST form completed 2. Increase HH services     More than 50% of the visit was spent in counseling/coordination of care: YES  Time Spent:35 minutes

## 2014-06-05 LAB — BASIC METABOLIC PANEL
Anion gap: 6 (ref 5–15)
BUN: 15 mg/dL (ref 6–20)
CHLORIDE: 109 mmol/L (ref 101–111)
CO2: 28 mmol/L (ref 22–32)
Calcium: 7.6 mg/dL — ABNORMAL LOW (ref 8.9–10.3)
Creatinine, Ser: 0.77 mg/dL (ref 0.61–1.24)
GFR calc Af Amer: >60 mL/min (ref >60)
Glucose, Bld: 89 mg/dL (ref 65–99)
Potassium: 3.8 mmol/L (ref 3.5–5.1)
Sodium: 143 mmol/L (ref 135–145)

## 2014-06-05 LAB — CBC WITH DIFFERENTIAL/PLATELET
BASOS ABS: 0 10*3/uL (ref 0–0.1)
BASOS PCT: 1 %
EOS ABS: 0.1 10*3/uL (ref 0–0.7)
Eosinophils Relative: 2 %
HCT: 30.7 % — ABNORMAL LOW (ref 40.0–52.0)
HEMOGLOBIN: 10.3 g/dL — AB (ref 13.0–18.0)
LYMPHS ABS: 0.9 10*3/uL — AB (ref 1.0–3.6)
LYMPHS PCT: 16 %
MCH: 31.5 pg (ref 26.0–34.0)
MCHC: 33.7 g/dL (ref 32.0–36.0)
MCV: 93.4 fL (ref 80.0–100.0)
Monocytes Absolute: 0.4 10*3/uL (ref 0.2–1.0)
Monocytes Relative: 7 %
Neutro Abs: 4.2 10*3/uL (ref 1.4–6.5)
Neutrophils Relative %: 74 %
Platelets: 137 10*3/uL — ABNORMAL LOW (ref 150–440)
RBC: 3.28 MIL/uL — AB (ref 4.40–5.90)
RDW: 15.8 % — ABNORMAL HIGH (ref 11.5–14.5)
WBC: 5.8 10*3/uL (ref 3.8–10.6)

## 2014-06-05 MED ORDER — CEFUROXIME AXETIL 250 MG PO TABS: 250.0000 mg | ORAL_TABLET | Freq: Two times a day (BID) | ORAL | Status: DC

## 2014-06-05 MED ORDER — CEFUROXIME AXETIL 250 MG PO TABS
250.0000 mg | ORAL_TABLET | Freq: Two times a day (BID) | ORAL | Status: DC
  Filled 2014-06-05 (×3): qty 1

## 2014-06-05 NOTE — Discharge Summary (Signed)
Gabriel Valencia, is a 81 y.o. male  DOB 1919/02/22  MRN [id number].  Admission date:  06/02/2014  Admitting Physician  Shaune Pollack, MD  Discharge Date:  06/05/2014   Primary MD  Marguarite Arbour MD  Recommendations for primary care physician for things to follow:   Hematuria   Admission Diagnosis  UTI (lower urinary tract infection) [N39.0] [name] hematuria [R31.0] Foley catheter problem, initial encounter [T83.9XXA] Acute renal failure, unspecified acute renal failure type [N17.9]   Discharge Diagnosis  UTI (lower urinary tract infection) [N39.0] [name] hematuria [R31.0] Foley catheter problem, initial encounter [T83.9XXA] Acute renal failure, unspecified acute renal failure type [N17.9] , Dementia  Active Problems:   Acute cystitis   Leukocytosis   ARF (acute renal failure)   [name] hematuria      Past Medical History  Diagnosis Date  . Hypertension   . A-fib   . Dementia   . Arthritis   . Cataract     Past Surgical History  Procedure Laterality Date  . Cholecystectomy         History of present illness and  Hospital Course:     Kindly see H&P for history of present illness and admission details, please review complete Labs, Consult reports and Test reports for all details in brief  HPI  from the history and physical done on the day of admission    Hospital Course    Pt admitted with traumatic hematuria. Hematuria resolved. Seen by Urology. IV ABX changed to po. Now sent back to ALF for outpatient f/u.   Discharge Condition: stable   Follow UP  Follow-up Information    Follow up with SPARKS,JEFFREY D, MD In 1 week.   Specialty:  Internal Medicine   Contact information:   [address] [facility] [place] [address] [phone]         Discharge Instructions  and  Discharge Medications   Call PCP if hematuria returns     Medication List     TAKE these medications        acetaminophen 325 MG tablet  Commonly known as:  TYLENOL  Take 2 tablets (650 mg total) by mouth every 6 (six) hours as needed for mild pain (or Fever >/= 101).     aspirin EC 81 MG tablet  Take 81 mg by mouth daily.     bisacodyl 5 MG EC tablet  Commonly known as:  DULCOLAX  Take 1 tablet (5 mg total) by mouth daily as needed for moderate constipation.     cefUROXime 250 MG tablet  Commonly known as:  CEFTIN  Take 1 tablet (250 mg total) by mouth 2 (two) times daily with a meal.     digoxin 0.125 MG tablet  Commonly known as:  LANOXIN  Take 0.125 mg by mouth daily.     haloperidol 5 MG tablet  Commonly known as:  HALDOL  Take 5 mg by mouth 3 (three) times daily as needed for agitation.     isosorbide mononitrate  120 MG 24 hr tablet  Commonly known as:  IMDUR  Take 120 mg by mouth daily.     levothyroxine 50 MCG tablet  Commonly known as:  SYNTHROID, LEVOTHROID  Take 50 mcg by mouth daily.     loratadine 10 MG tablet  Commonly known as:  CLARITIN  Take 1 tablet (10 mg total) by mouth daily.     losartan 25 MG tablet  Commonly known as:  COZAAR  Take 1 tablet (25 mg total) by mouth daily.     magnesium hydroxide 400 MG/5ML suspension  Commonly known as:  MILK OF MAGNESIA  Take 30 mLs by mouth daily as needed for mild constipation.     metoprolol 50 MG tablet  Commonly known as:  LOPRESSOR  Take 1 tablet (50 mg total) by mouth 2 (two) times daily.     nitroGLYCERIN 0.4 MG SL tablet  Commonly known as:  NITROSTAT  Place 0.4 mg under the tongue every 5 (five) minutes as needed for chest pain.     omeprazole 20 MG capsule  Commonly known as:  PRILOSEC  Take 20 mg by mouth 2 (two) times daily.     ondansetron 4 MG tablet  Commonly known as:  ZOFRAN  Take 1 tablet (4 mg total) by mouth every 6 (six) hours as needed for nausea.     oxybutynin 5 MG tablet  Commonly known as:  DITROPAN  Take 5 mg by mouth 3 (three) times daily.      QUEtiapine 25 MG tablet  Commonly known as:  SEROQUEL  Take 25-50 mg by mouth 2 (two) times daily. 25 mg AM; 50 mg PM     simvastatin 40 MG tablet  Commonly known as:  ZOCOR  Take 40 mg by mouth at bedtime.          Diet and Activity recommendation: See Discharge Instructions above   Consults obtained - Urology   Major procedures and Radiology Reports - PLEASE review detailed and final reports for all details, in brief -   none   Dg Abd 1 View  05/26/2014   CLINICAL DATA:  81 year old male with hematuria and abdominal pain. Initial encounter.  EXAM: ABDOMEN - 1 VIEW  COMPARISON:  Report of [facility] abdomen series 10/29/2013 (no images available). [facility] CT Abdomen and Pelvis 04/05/2012, and earlier.  FINDINGS: Portable AP view of the abdomen and pelvis at 0830 hours. A catheter projects over the central pelvis. Non obstructed bowel gas pattern. Stable cholecystectomy clips. No nephrolithiasis. Small pelvic phleboliths appear stable. No definite urologic calculus. Chronic degenerative changes in the spine.  IMPRESSION: Non obstructed bowel gas pattern and no definite urologic calculus identified.  Bladder catheter versus rectal tube projects over the pelvis.   Electronically Signed   By: Odessa FlemingH  Hall M.D.   On: 05/26/2014 08:41    Micro Results   See below  Recent Results (from the past 240 hour(s))  Urine culture     Status: None (Preliminary result)   Collection Time: 06/02/14  6:54 PM  Result Value Ref Range Status   Specimen Description URINE, CLEAN CATCH  Final   Special Requests Normal  Final   Culture   Final    >=100,000 COLONIES/mL GNR IDENTIFICATION AND SUSCEPTIBILITIES TO FOLLOW REPEATING TO VERIFY ID AND SENSITIVITIES    Report Status PENDING  Incomplete  Blood culture (routine x 2)     Status: None (Preliminary result)   Collection Time: 06/02/14  9:02 PM  Result Value Ref Range Status   Specimen Description BLOOD  Final    Special Requests Normal  Final   Culture NO GROWTH 3 DAYS  Final   Report Status PENDING  Incomplete  Blood culture (routine x 2)     Status: None (Preliminary result)   Collection Time: 06/02/14  9:02 PM  Result Value Ref Range Status   Specimen Description BLOOD  Final   Special Requests Normal  Final   Culture NO GROWTH 3 DAYS  Final   Report Status PENDING  Incomplete       Today   Subjective:   Gabriel PerchesOdell Valencia today has no headache,no chest abdominal pain,no new weakness tingling or numbness, feels much better wants to go home today.   Objective:   Blood pressure 137/70, pulse 122, temperature 97.6 F (36.4 C), temperature source Oral, resp. rate 18, height 5\' 7"  (1.702 m), weight 62.188 kg (137 lb 1.6 oz), SpO2 94 %.   Intake/Output Summary (Last 24 hours) at 06/05/14 0733 Last data filed at 06/05/14 0427  Gross per 24 hour  Intake 1302.5 ml  Output   1575 ml  Net -272.5 ml    Exam Awake Alert, confused, No new F.N deficits, Normal affect [place].AT,PERRAL Supple Neck,No JVD, No cervical lymphadenopathy appriciated.  Symmetrical Chest wall movement, Good air movement bilaterally, CTAB,irregularly irregular,No Gallops,Rubs or new Murmurs, No Parasternal Heave +ve B.Sounds, Abd Soft, Non tender, No organomegaly appriciated, No rebound -guarding or rigidity. No Cyanosis, Clubbing or edema, No new Rash or bruise  Data Review   CBC w Diff: Lab Results  Component Value Date   WBC 5.8 06/05/2014   WBC 5.6 05/18/2014   HGB 10.3* 06/05/2014   HGB 12.3* 05/18/2014   HCT 30.7* 06/05/2014   HCT 36.9* 05/18/2014   PLT 137* 06/05/2014   PLT 106* 05/18/2014   LYMPHOPCT 16 06/05/2014   LYMPHOPCT 15.4 05/18/2014   MONOPCT 7 06/05/2014   MONOPCT 9.3 05/18/2014   EOSPCT 2 06/05/2014   EOSPCT 1.4 05/18/2014   BASOPCT 1 06/05/2014   BASOPCT 0.3 05/18/2014    CMP: Lab Results  Component Value Date   NA 142 06/03/2014   NA 139 05/18/2014   K 3.9 06/03/2014   K 4.9  05/18/2014   CL 107 06/03/2014   CL 104 05/18/2014   CO2 29 06/03/2014   CO2 32 05/18/2014   BUN 27* 06/03/2014   BUN 29* 05/18/2014   CREATININE 1.28* 06/03/2014   CREATININE 0.96 05/18/2014   PROT 5.5* 05/18/2014   ALBUMIN 3.1* 05/18/2014   ALKPHOS 58 05/18/2014   AST 47* 05/18/2014   ALT 15* 05/18/2014  .   Total Time in preparing paper work, data evaluation and todays exam - 45 minutes  SPARKS,JEFFREY D M.D on 06/05/2014 at 7:33 AM

## 2014-06-05 NOTE — Discharge Instructions (Signed)
Follow up with Urology ASAP

## 2014-06-05 NOTE — Progress Notes (Signed)
Patient is medically stable for D/[facility] today. Clinical Child psychotherapistocial Worker (CSW) confirmed with Teacher, musicAmy director of [facility] yesterday that patient can return today. CSW contacted Rosey Batheresa at [facility] today and made her aware of above. RN will call report to Lupita LeashDonna at [facility] and patient's wife will transport. CSW prepared D/C packet and faxed D/C Summary, FL2 and prescription to [facility]. Patient's wife Eber JonesCarolyn is at bedside and aware of above. Please reconsult if future social work needs arise. CSW signing off.   Jetta LoutBailey Morgan, LCSWA [phone]

## 2014-06-05 NOTE — Care Management (Signed)
Patient discharging back to ALF today followed by [facility] program. Lonn GeorgiaBarb Taylor RN with [facility] notified of patient discharge. CSW will make final arrangements for discharging RN. No further RNCM needs.

## 2014-06-05 NOTE — Progress Notes (Signed)
Patient was discharged to [facility] with foley draining and intact. Report was given to [facility]up[facility]na at [facility]. [facility].  Hennessey Cantrell, RN

## 2014-06-06 LAB — URINE CULTURE
Culture: >=100000
Special Requests: NORMAL

## 2014-06-07 LAB — CULTURE, BLOOD (ROUTINE X 2)
CULTURE: NO GROWTH
CULTURE: NO GROWTH
SPECIAL REQUESTS: NORMAL
Special Requests: NORMAL

## 2014-06-14 ENCOUNTER — Encounter: Payer: Self-pay | Admitting: Internal Medicine

## 2014-06-14 ENCOUNTER — Inpatient Hospital Stay
Admission: EM | Admit: 2014-06-14 | Discharge: 2014-06-16 | DRG: 871 | Disposition: A | Discharge status: Hospice | Payer: PPO | Attending: Internal Medicine | Admitting: Internal Medicine

## 2014-06-14 DIAGNOSIS — Z515 Encounter for palliative care: Secondary | ICD-10-CM

## 2014-06-14 DIAGNOSIS — Z7982 Long term (current) use of aspirin: Secondary | ICD-10-CM | POA: Diagnosis not present

## 2014-06-14 DIAGNOSIS — R627 Adult failure to thrive: Secondary | ICD-10-CM | POA: Diagnosis present

## 2014-06-14 DIAGNOSIS — H269 Unspecified cataract: Secondary | ICD-10-CM | POA: Diagnosis present

## 2014-06-14 DIAGNOSIS — E039 Hypothyroidism, unspecified: Secondary | ICD-10-CM | POA: Diagnosis present

## 2014-06-14 DIAGNOSIS — N390 Urinary tract infection, site not specified: Secondary | ICD-10-CM | POA: Diagnosis present

## 2014-06-14 DIAGNOSIS — N39498 Other specified urinary incontinence: Secondary | ICD-10-CM | POA: Diagnosis present

## 2014-06-14 DIAGNOSIS — N401 Enlarged prostate with lower urinary tract symptoms: Secondary | ICD-10-CM | POA: Diagnosis present

## 2014-06-14 DIAGNOSIS — I482 Chronic atrial fibrillation: Secondary | ICD-10-CM | POA: Diagnosis present

## 2014-06-14 DIAGNOSIS — I10 Essential (primary) hypertension: Secondary | ICD-10-CM | POA: Diagnosis present

## 2014-06-14 DIAGNOSIS — R6520 Severe sepsis without septic shock: Secondary | ICD-10-CM | POA: Diagnosis present

## 2014-06-14 DIAGNOSIS — F0280 Dementia in other diseases classified elsewhere without behavioral disturbance: Secondary | ICD-10-CM | POA: Diagnosis present

## 2014-06-14 DIAGNOSIS — A419 Sepsis, unspecified organism: Secondary | ICD-10-CM | POA: Diagnosis present

## 2014-06-14 DIAGNOSIS — M1990 Unspecified osteoarthritis, unspecified site: Secondary | ICD-10-CM | POA: Diagnosis present

## 2014-06-14 DIAGNOSIS — E860 Dehydration: Secondary | ICD-10-CM | POA: Diagnosis present

## 2014-06-14 DIAGNOSIS — G309 Alzheimer's disease, unspecified: Secondary | ICD-10-CM | POA: Diagnosis present

## 2014-06-14 DIAGNOSIS — N179 Acute kidney failure, unspecified: Secondary | ICD-10-CM | POA: Diagnosis present

## 2014-06-14 DIAGNOSIS — Z79899 Other long term (current) drug therapy: Secondary | ICD-10-CM

## 2014-06-14 DIAGNOSIS — Z66 Do not resuscitate: Secondary | ICD-10-CM | POA: Diagnosis present

## 2014-06-14 DIAGNOSIS — F17210 Nicotine dependence, cigarettes, uncomplicated: Secondary | ICD-10-CM | POA: Diagnosis present

## 2014-06-14 DIAGNOSIS — R319 Hematuria, unspecified: Secondary | ICD-10-CM | POA: Diagnosis present

## 2014-06-14 DIAGNOSIS — G9340 Encephalopathy, unspecified: Secondary | ICD-10-CM | POA: Diagnosis present

## 2014-06-14 DIAGNOSIS — R400 Somnolence: Secondary | ICD-10-CM

## 2014-06-14 HISTORY — DX: Urinary tract infection, site not specified: N39.0

## 2014-06-14 HISTORY — DX: Presence of urogenital implants: Z96.0

## 2014-06-14 HISTORY — DX: Presence of other specified devices: Z97.8

## 2014-06-14 LAB — URINALYSIS COMPLETE WITH MICROSCOPIC (ARMC ONLY)
Bilirubin Urine: NEGATIVE
GLUCOSE, UA: NEGATIVE mg/dL
KETONES UR: NEGATIVE mg/dL
NITRITE: NEGATIVE
PH: 7 (ref 5.0–8.0)
SPECIFIC GRAVITY, URINE: 1.02 (ref 1.005–1.030)
Squamous Epithelial / LPF: NONE SEEN

## 2014-06-14 LAB — COMPREHENSIVE METABOLIC PANEL
ALK PHOS: 66 U/L (ref 38–126)
ALT: 12 U/L — AB (ref 17–63)
ANION GAP: 9 (ref 5–15)
AST: 14 U/L — ABNORMAL LOW (ref 15–41)
Albumin: 3.3 g/dL — ABNORMAL LOW (ref 3.5–5.0)
BUN: 56 mg/dL — ABNORMAL HIGH (ref 6–20)
CALCIUM: 8.7 mg/dL — AB (ref 8.9–10.3)
CHLORIDE: 101 mmol/L (ref 101–111)
CO2: 27 mmol/L (ref 22–32)
Creatinine, Ser: 4.04 mg/dL — ABNORMAL HIGH (ref 0.61–1.24)
GFR calc Af Amer: 13 mL/min — ABNORMAL LOW (ref >60)
GFR, EST NON AFRICAN AMERICAN: 12 mL/min — AB (ref >60)
GLUCOSE: 130 mg/dL — AB (ref 65–99)
POTASSIUM: 4.9 mmol/L (ref 3.5–5.1)
Sodium: 137 mmol/L (ref 135–145)
TOTAL PROTEIN: 6.8 g/dL (ref 6.5–8.1)
Total Bilirubin: 1 mg/dL (ref 0.3–1.2)

## 2014-06-14 LAB — CBC
HCT: 36.3 % — ABNORMAL LOW (ref 40.0–52.0)
Hemoglobin: 12 g/dL — ABNORMAL LOW (ref 13.0–18.0)
MCH: 31.2 pg (ref 26.0–34.0)
MCHC: 33 g/dL (ref 32.0–36.0)
MCV: 94.4 fL (ref 80.0–100.0)
Platelets: 141 10*3/uL — ABNORMAL LOW (ref 150–440)
RBC: 3.84 MIL/uL — AB (ref 4.40–5.90)
RDW: 15.9 % — ABNORMAL HIGH (ref 11.5–14.5)
WBC: 23.7 10*3/uL — ABNORMAL HIGH (ref 3.8–10.6)

## 2014-06-14 LAB — LACTIC ACID, PLASMA: Lactic Acid, Venous: 1.4 mmol/L (ref 0.5–2.0)

## 2014-06-14 LAB — DIGOXIN LEVEL: DIGOXIN LVL: 1.4 ng/mL (ref 0.8–2.0)

## 2014-06-14 MED ORDER — SODIUM CHLORIDE 0.9 % IV BOLUS (SEPSIS)
1000.0000 mL | INTRAVENOUS | Status: AC
  Administered 2014-06-14: 1000 mL via INTRAVENOUS

## 2014-06-14 MED ORDER — HYDRALAZINE HCL 20 MG/ML IJ SOLN: 10.0000 mg | Freq: Four times a day (QID) | INTRAMUSCULAR | Status: DC | PRN

## 2014-06-14 MED ORDER — MEROPENEM 500 MG IV SOLR
500.0000 mg | Freq: Two times a day (BID) | INTRAVENOUS | Status: DC
  Administered 2014-06-14: 500 mg via INTRAVENOUS
  Filled 2014-06-14 (×4): qty 0.5

## 2014-06-14 MED ORDER — CEFTRIAXONE SODIUM IN DEXTROSE 40 MG/ML IV SOLN
2.0000 g | Freq: Once | INTRAVENOUS | Status: DC
  Filled 2014-06-14: qty 50

## 2014-06-14 MED ORDER — SODIUM CHLORIDE 0.9 % IV SOLN: 500.0000 mg | Freq: Four times a day (QID) | INTRAVENOUS | Status: DC

## 2014-06-14 NOTE — ED Notes (Addendum)
Pt arrived via EMS from [facility] due to altered mental status beginning yesterday. Staff reported to EMS that pt is not following commands and is off pts normal baseline activity. Pt is nonverbal throughout assessment and will not answer questions asked by EMS or this RN. Facility denies pt having fall, reports decreased urine output but also reports decreased fluid and food intake. Pthas not received any of normal PO medications today per EMS report. CBG with EMS of 144. No fever reported.

## 2014-06-14 NOTE — ED Notes (Signed)
Notified MD of elevated Blood Pressure. No new orders at this time.

## 2014-06-14 NOTE — Progress Notes (Signed)
ANTIBIOTIC CONSULT NOTE - INITIAL  Pharmacy Consult for Meropenem Indication: ESBL infection, sepsis  No Known Allergies  Patient Measurements: Height: 6' (182.9 cm) Weight: 142 lb (64.411 kg) IBW/kg (Calculated) : 77.6 Adjusted Body Weight:   Vital Signs: Temp: 98.3 F (36.8 C) (05/22 1713) Temp Source: Oral (05/22 1713) BP: 163/109 mmHg (05/22 2132) Pulse Rate: 99 (05/22 2132) Intake/Output from previous day:   Intake/Output from this shift:    Labs:  Recent Labs  06/14/14 1818  WBC 23.7*  HGB 12.0*  PLT 141*  CREATININE 4.04*   Estimated Creatinine Clearance: 10.4 mL/min (by C-G formula based on Cr of 4.04). No results for input(s): VANCOTROUGH, VANCOPEAK, VANCORANDOM, GENTTROUGH, GENTPEAK, GENTRANDOM, TOBRATROUGH, TOBRAPEAK, TOBRARND, AMIKACINPEAK, AMIKACINTROU, AMIKACIN in the last 72 hours.   Microbiology: Recent Results (from the past 720 hour(s))  Urine culture     Status: None   Collection Time: 05/16/14  1:08 PM  Result Value Ref Range Status   Micro Text Report   Final       SOURCE: INDWELLING CATH    COMMENT                   MIXED BACTERIAL ORGANISMS   COMMENT                   RESULTS SUGGESTIVE OF CONTAMINATION   COMMENT                   SUGGEST RECOLLECTION   ANTIBIOTIC                                                      Urine culture     Status: None   Collection Time: 05/18/14  7:50 PM  Result Value Ref Range Status   Micro Text Report   Final       SOURCE: INDWELLING CATHETER    ORGANISM 1                >100,000 CFU/ML Candida tropicalis   ORGANISM 2                50,000 CFU GRAM POSITIVE ROD   COMMENT                   -   COMMENT                   -   ANTIBIOTIC                    ORG#1                                               Urine culture     Status: None   Collection Time: 06/02/14  6:54 PM  Result Value Ref Range Status   Specimen Description URINE, CLEAN CATCH  Final   Special Requests Normal  Final   Culture  >=100,000 COLONIES/mL CITROBACTER YOUNGAE  Final   Report Status 06/06/2014 FINAL  Final   Organism ID, Bacteria CITROBACTER YOUNGAE  Final      Susceptibility   Citrobacter youngae - MIC*    CEFTAZIDIME >=64 RESISTANT Resistant     CEFAZOLIN >=64  RESISTANT Resistant     CEFTRIAXONE >=64 RESISTANT Resistant     CIPROFLOXACIN <=0.25 SENSITIVE Sensitive     GENTAMICIN <=1 SENSITIVE Sensitive     IMIPENEM <=0.25 SENSITIVE Sensitive     TRIMETH/SULFA <=20 SENSITIVE Sensitive     CEFOXITIN >=64 RESISTANT Resistant     * >=100,000 COLONIES/mL CITROBACTER YOUNGAE  Blood culture (routine x 2)     Status: None   Collection Time: 06/02/14  9:02 PM  Result Value Ref Range Status   Specimen Description BLOOD  Final   Special Requests Normal  Final   Culture NO GROWTH 5 DAYS  Final   Report Status 06/07/2014 FINAL  Final  Blood culture (routine x 2)     Status: None   Collection Time: 06/02/14  9:02 PM  Result Value Ref Range Status   Specimen Description BLOOD  Final   Special Requests Normal  Final   Culture NO GROWTH 5 DAYS  Final   Report Status 06/07/2014 FINAL  Final    Medical History: Past Medical History  Diagnosis Date  . Hypertension   . A-fib   . Dementia   . Arthritis   . Cataract   . Chronic indwelling Foley catheter   . Recurrent UTI     Medications:  Scheduled:   Assessment:  CrCl = 10.4 ml/min  Goal of Therapy:    Plan:  Expected duration 7 days with resolution of temperature and/or normalization of WBC  Will start Meropenem 500 mg IV Q12H.   Myangel Summons D 06/14/2014,9:36 PM

## 2014-06-14 NOTE — ED Provider Notes (Signed)
[facility] Emergency Department Provider Note  ____________________________________________  Time seen: 1815  I have reviewed the triage vital signs and the nursing notes.  History is limited as the patient is not communicative. The wife is present and she is very helpful. The majority of history comes from her.  HISTORY  Chief Complaint Altered Mental Status    HPI Gabriel Valencia is a 81 y.o. male who stays at [facility]. The patient has a history of dementia. The patient's wife says the patient is not following commands and is not acting as usually does. He had a recent birthday. Family visited in the nursing home. He was alert and is active as he ever is. Now he won't respond to the wife. She reports he usually will respond to her.  Patient was seen by his primary care doctor, Dr. Judithann Sheen, recently. At that time he had some discomfort in his lower abdomen. He does have an indwelling Foley in. The primary care doctor referred him to his urologist in [place]. He has not had a visit with the urologist yet.     Past Medical History  Diagnosis Date  . Hypertension   . A-fib   . Dementia   . Arthritis   . Cataract     Patient Active Problem List   Diagnosis Date Noted  . Frank hematuria   . Acute cystitis 06/02/2014  . Leukocytosis 06/02/2014  . ARF (acute renal failure) 06/02/2014  . Protein-calorie malnutrition, severe 05/27/2014  . Acute cystitis with hematuria 05/25/2014  . Hematuria 05/25/2014    Past Surgical History  Procedure Laterality Date  . Cholecystectomy      Current Outpatient Rx  Name  Route  Sig  Dispense  Refill  . acetaminophen (TYLENOL) 325 MG tablet   Oral   Take 2 tablets (650 mg total) by mouth every 6 (six) hours as needed for mild pain (or Fever >/= 101).   100 tablet   0   . aspirin EC 81 MG tablet   Oral   Take 81 mg by mouth daily.         . bisacodyl (DULCOLAX) 5 MG EC tablet   Oral   Take 1 tablet (5  mg total) by mouth daily as needed for moderate constipation.   30 tablet   0   . cefUROXime (CEFTIN) 250 MG tablet   Oral   Take 1 tablet (250 mg total) by mouth 2 (two) times daily with a meal.   20 tablet   0   . digoxin (LANOXIN) 0.125 MG tablet   Oral   Take 0.125 mg by mouth daily.         . haloperidol (HALDOL) 5 MG tablet   Oral   Take 5 mg by mouth 3 (three) times daily as needed for agitation.         . isosorbide mononitrate (IMDUR) 120 MG 24 hr tablet   Oral   Take 120 mg by mouth daily.         Marland Kitchen levothyroxine (SYNTHROID, LEVOTHROID) 50 MCG tablet   Oral   Take 50 mcg by mouth daily.         Marland Kitchen loratadine (CLARITIN) 10 MG tablet   Oral   Take 1 tablet (10 mg total) by mouth daily.   30 tablet   5   . losartan (COZAAR) 25 MG tablet   Oral   Take 1 tablet (25 mg total) by mouth daily.   30 tablet  5   . magnesium hydroxide (MILK OF MAGNESIA) 400 MG/5ML suspension   Oral   Take 30 mLs by mouth daily as needed for mild constipation.   360 mL   0   . metoprolol (LOPRESSOR) 50 MG tablet   Oral   Take 1 tablet (50 mg total) by mouth 2 (two) times daily.   60 tablet   5   . nitroGLYCERIN (NITROSTAT) 0.4 MG SL tablet   Sublingual   Place 0.4 mg under the tongue every 5 (five) minutes as needed for chest pain.         Marland Kitchen. omeprazole (PRILOSEC) 20 MG capsule   Oral   Take 20 mg by mouth 2 (two) times daily.         . ondansetron (ZOFRAN) 4 MG tablet   Oral   Take 1 tablet (4 mg total) by mouth every 6 (six) hours as needed for nausea.   20 tablet   0   . oxybutynin (DITROPAN) 5 MG tablet   Oral   Take 5 mg by mouth 3 (three) times daily.         . QUEtiapine (SEROQUEL) 25 MG tablet   Oral   Take 25-50 mg by mouth 2 (two) times daily. 25 mg AM; 50 mg PM         . simvastatin (ZOCOR) 40 MG tablet   Oral   Take 40 mg by mouth at bedtime.           Allergies Review of patient's allergies indicates no known  allergies.  Family History  Problem Relation Age of Onset  . Stroke Father   . Cancer Brother     Social History History  Substance Use Topics  . Smoking status: Smoker, Current Status Unknown  . Smokeless tobacco: Not on file  . Alcohol Use: No     Comment: Pt poor historian, unable to give history.    Review of Systems Unable to do a review of system due to the patient is noncommunicative.  ____________________________________________   PHYSICAL EXAM:  VITAL SIGNS: ED Triage Vitals  Enc Vitals Group     BP 06/14/14 1713 167/104 mmHg     Pulse Rate 06/14/14 1713 84     Resp 06/14/14 1713 20     Temp 06/14/14 1713 98.3 F (36.8 C)     Temp Source 06/14/14 1713 Oral     SpO2 06/14/14 1713 98 %     Weight 06/14/14 1713 142 lb (64.411 kg)     Height 06/14/14 1713 6' (1.829 m)     Head Cir --      Peak Flow --      Pain Score 06/14/14 1714 5     Pain Loc --      Pain Edu? --      Excl. in GC? --     Constitutional: Elderly male lying in bed with eyes closed. He withdraws to touch. He grimaces on abdominal exam. He does not speak or respond otherwise. ENT   Head: Normocephalic and atraumatic.    Cardiovascular: Normal rate, regular rhythm. Respiratory: Normal respiratory effort without tachypnea. Breath sounds are clear and equal bilaterally. No wheezes/rales/rhonchi. Gastrointestinal: Firm abdomen. Patient grimaces some and attempts to move my hand during the exam. Otherwise no acute distress area exam is limited..  Back: No muscle spasm, no tenderness, no CVA tenderness. Musculoskeletal: Nontender with normal range of motion in all extremities.  No noted edema. Neurologic: Nonverbal. Altered mental status.  Does withdraw to noxious stimuli.  Skin:  Skin is warm, dry. No rash noted. Psychiatric: Nonverbal, unresponsive to voice..  ____________________________________________    LABS (pertinent positives/negatives)  White blood cell count 23.7 hemoglobin  of 12.0 BUN of 56 with creatinine of 4.0. Sodium potassium are within normal limits. Urinalysis shows white blood cells and red blood cells too numerous to count. Digoxin is normal at 1.4. Lactic acid is okay at 1.4 ____________________________________________   EKG  ED ECG REPORT I, Reizy Dunlow W, the attending physician, personally viewed and interpreted this ECG.   Date: 06/14/2014  EKG Time: 1842  Rate: 93  Rhythm: Atrial fibrillation  Axis: 9  Intervals:Normal QRS duration and QTC  ST&T Change: Abnormal T wave in lead 2 and leads V4 through V6.   ____________________________________________   ________________________   PROCEDURES  Critical Care performed: Yes, see critical care note(s)   CRITICAL CARE Performed by: Darien Ramus  Patient with sepsis, likely from a  urologic source. He also has acute renal failure. He has leukocytosis to 23.7. He has been treated with IV antibiotics. We have discussed his care with the admitting physicians and with the family. Total critical care time: 35 minutes  Critical care time was exclusive of separately billable procedures and treating other patients.  Critical care was necessary to treat or prevent imminent or life-threatening deterioration.  Critical care was time spent personally by me on the following activities: development of treatment plan with patient and/or surrogate as well as nursing, discussions with consultants, evaluation of patient's response to treatment, examination of patient, obtaining history from patient or surrogate, ordering and performing treatments and interventions, ordering and review of laboratory studies, ordering and review of radiographic studies, pulse oximetry and re-evaluation of patient's condition.  ____________________________________________   INITIAL IMPRESSION / ASSESSMENT AND PLAN / ED COURSE Decreased alertness and activity. We suspect a possible urinary tract infection as well  as possible dehydration. Check blood and urine and reassess.  ----------------------------------------- 8:49 PM on 06/14/2014 -----------------------------------------  Notable white count 23.7 with renal dysfunction with elevated BUN and creatinine. We will continue rehydration and treatment with IV antibiotics. We will admit him to the hospital for sepsis likely of a urologic source.  ____________________________________________   FINAL CLINICAL IMPRESSION(S) / ED DIAGNOSES  Final diagnoses:  Sepsis due to urinary tract infection  Acute renal failure, unspecified acute renal failure type  Somnolence      Darien Ramus, MD 06/14/14 2051

## 2014-06-14 NOTE — H&P (Signed)
[facility] -  at [facility]   PATIENT NAME: Gabriel Valencia    MR#:  [id number]  DATE OF BIRTH:  [birth year]  DATE OF ADMISSION:  06/14/2014  PRIMARY CARE PHYSICIAN: Osborne Oman MD  REQUESTING/REFERRING PHYSICIAN: Dr. Manson Passey  CHIEF COMPLAINT:   Chief Complaint  Patient presents with  . Altered Mental Status    HISTORY OF PRESENT ILLNESS:  Gabriel Valencia  is a 81 y.o. male with a known history of atrial fibrillation, chronic Foley catheter, recurrent urinary tract infection and dementia presents to the emergency room with worsening confusion and decreased oral intake. History is being obtained from wife at bedside reviewing old records and discussing with the ER staff. Patient is nonverbal has acute encephalopathy or dementia. Patient was recently treated with Keflex for UTI. Seemed to improve but started worsening over the last 3 days. Here in the emergency room he has been found to have acute renal failure, severe sepsis, UTI.  PAST MEDICAL HISTORY:   Past Medical History  Diagnosis Date  . Hypertension   . A-fib   . Dementia   . Arthritis   . Cataract   . Chronic indwelling Foley catheter   . Recurrent UTI     PAST SURGICAL HISTORY:   Past Surgical History  Procedure Laterality Date  . Cholecystectomy      SOCIAL HISTORY:   History  Substance Use Topics  . Smoking status: Smoker, Current Status Unknown  . Smokeless tobacco: Not on file  . Alcohol Use: No     Comment: Pt poor historian, unable to give history.    FAMILY HISTORY:   Family History  Problem Relation Age of Onset  . Stroke Father   . Cancer Brother     DRUG ALLERGIES:  No Known Allergies  REVIEW OF SYSTEMS:   ROS  Unobtainable due to encephalopathy  MEDICATIONS AT HOME:   Prior to Admission medications   Medication Sig Start Date End Date Taking? Authorizing Provider  acetaminophen (TYLENOL) 325 MG tablet Take 2 tablets (650 mg total) by mouth every 6  (six) hours as needed for mild pain (or Fever >/= 101). 05/27/14   Marguarite Arbour, MD  aspirin EC 81 MG tablet Take 81 mg by mouth daily.    Historical Provider, MD  bisacodyl (DULCOLAX) 5 MG EC tablet Take 1 tablet (5 mg total) by mouth daily as needed for moderate constipation. 05/27/14   Marguarite Arbour, MD  cefUROXime (CEFTIN) 250 MG tablet Take 1 tablet (250 mg total) by mouth 2 (two) times daily with a meal. 06/05/14   Marguarite Arbour, MD  digoxin (LANOXIN) 0.125 MG tablet Take 0.125 mg by mouth daily.    Historical Provider, MD  haloperidol (HALDOL) 5 MG tablet Take 5 mg by mouth 3 (three) times daily as needed for agitation.    Historical Provider, MD  isosorbide mononitrate (IMDUR) 120 MG 24 hr tablet Take 120 mg by mouth daily.    Historical Provider, MD  levothyroxine (SYNTHROID, LEVOTHROID) 50 MCG tablet Take 50 mcg by mouth daily.    Historical Provider, MD  loratadine (CLARITIN) 10 MG tablet Take 1 tablet (10 mg total) by mouth daily. 05/27/14   Marguarite Arbour, MD  losartan (COZAAR) 25 MG tablet Take 1 tablet (25 mg total) by mouth daily. 05/27/14   Marguarite Arbour, MD  magnesium hydroxide (MILK OF MAGNESIA) 400 MG/5ML suspension Take 30 mLs by mouth daily as needed for mild constipation. 05/27/14  Marguarite ArbourJeffrey D Sparks, MD  metoprolol (LOPRESSOR) 50 MG tablet Take 1 tablet (50 mg total) by mouth 2 (two) times daily. 05/27/14   Marguarite ArbourJeffrey D Sparks, MD  nitroGLYCERIN (NITROSTAT) 0.4 MG SL tablet Place 0.4 mg under the tongue every 5 (five) minutes as needed for chest pain.    Historical Provider, MD  omeprazole (PRILOSEC) 20 MG capsule Take 20 mg by mouth 2 (two) times daily.    Historical Provider, MD  ondansetron (ZOFRAN) 4 MG tablet Take 1 tablet (4 mg total) by mouth every 6 (six) hours as needed for nausea. 05/27/14   Marguarite ArbourJeffrey D Sparks, MD  oxybutynin (DITROPAN) 5 MG tablet Take 5 mg by mouth 3 (three) times daily.    Historical Provider, MD  QUEtiapine (SEROQUEL) 25 MG tablet Take 25-50 mg by  mouth 2 (two) times daily. 25 mg AM; 50 mg PM    Historical Provider, MD  simvastatin (ZOCOR) 40 MG tablet Take 40 mg by mouth at bedtime.    Historical Provider, MD      VITAL SIGNS:  Blood pressure 155/114, pulse 97, temperature 98.3 F (36.8 C), temperature source Oral, resp. rate 16, height 6' (1.829 m), weight 64.411 kg (142 lb), SpO2 96 %.  PHYSICAL EXAMINATION:  Physical Exam  GENERAL:  81 y.o.-year-old patient lying in the bed Non verbal EYES: Pupils equal, round, reactive to light and accommodation. No scleral icterus. Extraocular muscles intact.  HEENT: Head atraumatic, normocephalic. Oropharynx and nasopharynx clear. No oropharyngeal erythema, moist oral mucosa  NECK:  Supple, no jugular venous distention. No thyroid enlargement, no tenderness.  LUNGS: Normal breath sounds bilaterally, no wheezing, rales, rhonchi. No use of accessory muscles of respiration.  CARDIOVASCULAR: S1, S2 normal. No murmurs, rubs, or gallops.  ABDOMEN: Soft, Supra pubic tender, nondistended. Bowel sounds present. No organomegaly or mass.  GENITOURINARY - Foley in place, cloudy urine EXTREMITIES: No pedal edema, cyanosis, or clubbing. + 2 pedal & radial pulses b/l.   NEUROLOGIC: Moves all 4 extremities PSYCHIATRIC: The patient is non verbal  SKIN: No obvious rash, lesion, or ulcer.   LABORATORY PANEL:   CBC  Recent Labs Lab 06/14/14 1818  WBC 23.7*  HGB 12.0*  HCT 36.3*  PLT 141*   ------------------------------------------------------------------------------------------------------------------  Chemistries   Recent Labs Lab 06/14/14 1818  NA 137  K 4.9  CL 101  CO2 27  GLUCOSE 130*  BUN 56*  CREATININE 4.04*  CALCIUM 8.7*  AST 14*  ALT 12*  ALKPHOS 66  BILITOT 1.0   ------------------------------------------------------------------------------------------------------------------  Cardiac Enzymes No results for input(s): TROPONINI in the last 168  hours. ------------------------------------------------------------------------------------------------------------------  RADIOLOGY:  No results found.   IMPRESSION AND PLAN:   * Acute cystitis with hematuria with chronic indwelling foley catheter and Severe Sepsis Admit inpatient. Start Meropenem. Send for Cx.  * Acute renal failure Due to sepsis. Check renal US. Give IV fluids with NS Bolus 1 Liter now  * Acute encephalopathy over dementia due to UTI  * Chronic Afib with RVR -HR controlled -cont digoxin,metoprolol -Reduced digoxin to half the dose due to ARF.  * Hypothyroidism -cont synthroid   All the records are reviewed and case discussed with ED provider. Management plans discussed with the patient, family and they are in agreement.  CODE STATUS: DNR/DNI  TOTAL TIME TAKING CARE OF THIS PATIENT: 40 minutes.    Milagros LollSudini, Brittini Brubeck R M.D on 06/14/2014 at 9:22 PM  Between 7am to 6pm - Pager - [phone]  After 6pm go to [email or web address] -  password [email or web address] [facility]  [facility]  Office  [phone]  CC: Primary care physician; Osborne Oman MD

## 2014-06-15 ENCOUNTER — Inpatient Hospital Stay: Payer: PPO

## 2014-06-15 DIAGNOSIS — G301 Alzheimer's disease with late onset: Secondary | ICD-10-CM

## 2014-06-15 DIAGNOSIS — Z978 Presence of other specified devices: Secondary | ICD-10-CM

## 2014-06-15 DIAGNOSIS — Z515 Encounter for palliative care: Secondary | ICD-10-CM

## 2014-06-15 DIAGNOSIS — A419 Sepsis, unspecified organism: Secondary | ICD-10-CM | POA: Insufficient documentation

## 2014-06-15 DIAGNOSIS — I4891 Unspecified atrial fibrillation: Secondary | ICD-10-CM

## 2014-06-15 DIAGNOSIS — F0280 Dementia in other diseases classified elsewhere without behavioral disturbance: Secondary | ICD-10-CM

## 2014-06-15 DIAGNOSIS — F17210 Nicotine dependence, cigarettes, uncomplicated: Secondary | ICD-10-CM

## 2014-06-15 DIAGNOSIS — Z79899 Other long term (current) drug therapy: Secondary | ICD-10-CM

## 2014-06-15 DIAGNOSIS — R400 Somnolence: Secondary | ICD-10-CM

## 2014-06-15 DIAGNOSIS — I10 Essential (primary) hypertension: Secondary | ICD-10-CM

## 2014-06-15 DIAGNOSIS — N390 Urinary tract infection, site not specified: Secondary | ICD-10-CM

## 2014-06-15 DIAGNOSIS — M1990 Unspecified osteoarthritis, unspecified site: Secondary | ICD-10-CM

## 2014-06-15 LAB — CBC
HEMATOCRIT: 36.4 % — AB (ref 40.0–52.0)
Hemoglobin: 11.9 g/dL — ABNORMAL LOW (ref 13.0–18.0)
MCH: 30.7 pg (ref 26.0–34.0)
MCHC: 32.6 g/dL (ref 32.0–36.0)
MCV: 94.2 fL (ref 80.0–100.0)
Platelets: 137 10*3/uL — ABNORMAL LOW (ref 150–440)
RBC: 3.86 MIL/uL — ABNORMAL LOW (ref 4.40–5.90)
RDW: 16 % — ABNORMAL HIGH (ref 11.5–14.5)
WBC: 24.8 10*3/uL — AB (ref 3.8–10.6)

## 2014-06-15 LAB — BASIC METABOLIC PANEL
Anion gap: 9 (ref 5–15)
BUN: 66 mg/dL — AB (ref 6–20)
CALCIUM: 8.6 mg/dL — AB (ref 8.9–10.3)
CHLORIDE: 104 mmol/L (ref 101–111)
CO2: 24 mmol/L (ref 22–32)
Creatinine, Ser: 4.46 mg/dL — ABNORMAL HIGH (ref 0.61–1.24)
GFR calc Af Amer: 12 mL/min — ABNORMAL LOW (ref >60)
GFR calc non Af Amer: 10 mL/min — ABNORMAL LOW (ref >60)
Glucose, Bld: 108 mg/dL — ABNORMAL HIGH (ref 65–99)
Potassium: 4.7 mmol/L (ref 3.5–5.1)
Sodium: 137 mmol/L (ref 135–145)

## 2014-06-15 MED ORDER — QUETIAPINE FUMARATE 25 MG PO TABS: 25.0000 mg | ORAL_TABLET | Freq: Two times a day (BID) | ORAL | Status: DC

## 2014-06-15 MED ORDER — NITROGLYCERIN 0.4 MG SL SUBL: 0.4000 mg | SUBLINGUAL_TABLET | SUBLINGUAL | Status: DC | PRN

## 2014-06-15 MED ORDER — DIGOXIN 125 MCG PO TABS: 0.1250 mg | ORAL_TABLET | ORAL | Status: DC

## 2014-06-15 MED ORDER — MORPHINE SULFATE 2 MG/ML IJ SOLN
2.0000 mg | INTRAMUSCULAR | Status: DC | PRN
  Administered 2014-06-15 – 2014-06-16 (×4): 2 mg via INTRAVENOUS
  Filled 2014-06-15 (×4): qty 1

## 2014-06-15 MED ORDER — ACETAMINOPHEN 325 MG PO TABS: 650.0000 mg | ORAL_TABLET | Freq: Four times a day (QID) | ORAL | Status: DC | PRN

## 2014-06-15 MED ORDER — HEPARIN SODIUM (PORCINE) 5000 UNIT/ML IJ SOLN
5000.0000 [IU] | Freq: Three times a day (TID) | INTRAMUSCULAR | Status: DC
  Administered 2014-06-15 (×2): 5000 [IU] via SUBCUTANEOUS
  Filled 2014-06-15 (×2): qty 1

## 2014-06-15 MED ORDER — METOPROLOL TARTRATE 1 MG/ML IV SOLN: 5.0000 mg | Freq: Four times a day (QID) | INTRAVENOUS | Status: DC

## 2014-06-15 MED ORDER — BISACODYL 5 MG PO TBEC: 5.0000 mg | DELAYED_RELEASE_TABLET | Freq: Every day | ORAL | Status: DC | PRN

## 2014-06-15 MED ORDER — MAGNESIUM HYDROXIDE 400 MG/5ML PO SUSP: 30.0000 mL | Freq: Every day | ORAL | Status: DC | PRN

## 2014-06-15 MED ORDER — SODIUM CHLORIDE 0.9 % IV SOLN
500.0000 mg | INTRAVENOUS | Status: DC
  Filled 2014-06-15: qty 0.5

## 2014-06-15 MED ORDER — HALOPERIDOL LACTATE 5 MG/ML IJ SOLN: 2.0000 mg | Freq: Four times a day (QID) | INTRAMUSCULAR | Status: DC | PRN

## 2014-06-15 MED ORDER — LEVOTHYROXINE SODIUM 50 MCG PO TABS: 50.0000 ug | ORAL_TABLET | Freq: Every day | ORAL | Status: DC

## 2014-06-15 MED ORDER — ISOSORBIDE MONONITRATE ER 60 MG PO TB24: 120.0000 mg | ORAL_TABLET | Freq: Every day | ORAL | Status: DC

## 2014-06-15 MED ORDER — SODIUM CHLORIDE 0.9 % IV SOLN
INTRAVENOUS | Status: DC
  Administered 2014-06-15 (×2): via INTRAVENOUS

## 2014-06-15 MED ORDER — ASPIRIN EC 81 MG PO TBEC: 81.0000 mg | DELAYED_RELEASE_TABLET | Freq: Every day | ORAL | Status: DC

## 2014-06-15 MED ORDER — LORAZEPAM 2 MG/ML IJ SOLN: 0.5000 mg | INTRAMUSCULAR | Status: DC | PRN

## 2014-06-15 MED ORDER — PANTOPRAZOLE SODIUM 40 MG PO TBEC: 40.0000 mg | DELAYED_RELEASE_TABLET | Freq: Every day | ORAL | Status: DC

## 2014-06-15 MED ORDER — METOPROLOL TARTRATE 50 MG PO TABS: 50.0000 mg | ORAL_TABLET | Freq: Two times a day (BID) | ORAL | Status: DC

## 2014-06-15 MED ORDER — SIMVASTATIN 40 MG PO TABS: 40.0000 mg | ORAL_TABLET | Freq: Every day | ORAL | Status: DC

## 2014-06-15 MED ORDER — HYDROCODONE-ACETAMINOPHEN 5-325 MG PO TABS: 1.0000 | ORAL_TABLET | ORAL | Status: DC | PRN

## 2014-06-15 MED ORDER — QUETIAPINE FUMARATE 25 MG PO TABS
25.0000 mg | ORAL_TABLET | Freq: Every morning | ORAL | Status: DC
  Filled 2014-06-15: qty 1

## 2014-06-15 MED ORDER — OXYBUTYNIN CHLORIDE 5 MG PO TABS: 5.0000 mg | ORAL_TABLET | Freq: Three times a day (TID) | ORAL | Status: DC

## 2014-06-15 MED ORDER — QUETIAPINE FUMARATE 100 MG PO TABS: 50.0000 mg | ORAL_TABLET | Freq: Every day | ORAL | Status: DC

## 2014-06-15 MED ORDER — HALOPERIDOL 2 MG PO TABS: 5.0000 mg | ORAL_TABLET | Freq: Three times a day (TID) | ORAL | Status: DC | PRN

## 2014-06-15 MED ORDER — ONDANSETRON HCL 4 MG/2ML IJ SOLN: 4.0000 mg | Freq: Four times a day (QID) | INTRAMUSCULAR | Status: DC | PRN

## 2014-06-15 MED ORDER — ONDANSETRON HCL 4 MG PO TABS: 4.0000 mg | ORAL_TABLET | Freq: Four times a day (QID) | ORAL | Status: DC | PRN

## 2014-06-15 NOTE — Plan of Care (Signed)
Problem: Discharge Progression Outcomes Goal: Other Discharge Outcomes/Goals Plan of Care Progress to Goal: Pt on comfort care.  Only required pain med 1x.  Family at bedside.  Bereavement cart ordered. [facility].

## 2014-06-15 NOTE — Progress Notes (Signed)
Chaplain met with family members of patient, provided prayer and emotional support.  Loralyn Freshwater D. Alroy Dust [phone]   06/15/14 1500  Clinical Encounter Type  Visited With Patient and family together  Visit Type Initial;Spiritual support  Referral From Family (Friend of family wanted chaplain for the family. )  Consult/Referral To Chaplain  Spiritual Encounters  Spiritual Needs Prayer;Emotional  Stress Factors  Patient Stress Factors Health changes;Major life changes  Family Stress Factors Health changes;Major life changes (Patient is near death, family by bedside. )  -[phone]

## 2014-06-15 NOTE — Clinical Social Work Note (Signed)
[facility]. Palliative Care has spoken to patient's wife and son and they have chosen comfort measures at this time. Gabriel SeniorStephen with Palliative Care to transfer patient to 1C for comfort measures. Patient may not make it another 24 hours according to Palliative Care. York SpanielMonica Brekyn Huntoon MSW,LCSWA [phone]

## 2014-06-15 NOTE — Progress Notes (Signed)
ANTIBIOTIC CONSULT NOTE   Pharmacy Consult for Meropenem Indication: ESBL infection, sepsis  No Known Allergies  Patient Measurements: Height: 6' (182.9 cm) Weight: 133 lb 8 oz (60.555 kg) IBW/kg (Calculated) : 77.6 Adjusted Body Weight:   Vital Signs: Temp: 98.7 F (37.1 C) (05/23 0812) Temp Source: Oral (05/23 0812) BP: 180/92 mmHg (05/23 0830) Pulse Rate: 98 (05/23 0812) Intake/Output from previous day: 05/22 0701 - 05/23 0700 In: 376.7 [I.V.:376.7] Out: 250 [Urine:250] Intake/Output from this shift:    Labs:  Recent Labs  06/14/14 1818 06/15/14 0510  WBC 23.7* 24.8*  HGB 12.0* 11.9*  PLT 141* 137*  CREATININE 4.04* 4.46*   Estimated Creatinine Clearance: 8.9 mL/min (by C-G formula based on Cr of 4.46). No results for input(s): VANCOTROUGH, VANCOPEAK, VANCORANDOM, GENTTROUGH, GENTPEAK, GENTRANDOM, TOBRATROUGH, TOBRAPEAK, TOBRARND, AMIKACINPEAK, AMIKACINTROU, AMIKACIN in the last 72 hours.   Microbiology: Recent Results (from the past 720 hour(s))  Urine culture     Status: None   Collection Time: 05/16/14  1:08 PM  Result Value Ref Range Status   Micro Text Report   Final       SOURCE: INDWELLING CATH    COMMENT                   MIXED BACTERIAL ORGANISMS   COMMENT                   RESULTS SUGGESTIVE OF CONTAMINATION   COMMENT                   SUGGEST RECOLLECTION   ANTIBIOTIC                                                      Urine culture     Status: None   Collection Time: 05/18/14  7:50 PM  Result Value Ref Range Status   Micro Text Report   Final       SOURCE: INDWELLING CATHETER    ORGANISM 1                >100,000 CFU/ML Candida tropicalis   ORGANISM 2                50,000 CFU GRAM POSITIVE ROD   COMMENT                   -   COMMENT                   -   ANTIBIOTIC                    ORG#1                                               Urine culture     Status: None   Collection Time: 06/02/14  6:54 PM  Result Value Ref Range  Status   Specimen Description URINE, CLEAN CATCH  Final   Special Requests Normal  Final   Culture >=100,000 COLONIES/mL CITROBACTER YOUNGAE  Final   Report Status 06/06/2014 FINAL  Final   Organism ID, Bacteria CITROBACTER YOUNGAE  Final      Susceptibility   Citrobacter  youngae - MIC*    CEFTAZIDIME >=64 RESISTANT Resistant     CEFAZOLIN >=64 RESISTANT Resistant     CEFTRIAXONE >=64 RESISTANT Resistant     CIPROFLOXACIN <=0.25 SENSITIVE Sensitive     GENTAMICIN <=1 SENSITIVE Sensitive     IMIPENEM <=0.25 SENSITIVE Sensitive     TRIMETH/SULFA <=20 SENSITIVE Sensitive     CEFOXITIN >=64 RESISTANT Resistant     * >=100,000 COLONIES/mL CITROBACTER YOUNGAE  Blood culture (routine x 2)     Status: None   Collection Time: 06/02/14  9:02 PM  Result Value Ref Range Status   Specimen Description BLOOD  Final   Special Requests Normal  Final   Culture NO GROWTH 5 DAYS  Final   Report Status 06/07/2014 FINAL  Final  Blood culture (routine x 2)     Status: None   Collection Time: 06/02/14  9:02 PM  Result Value Ref Range Status   Specimen Description BLOOD  Final   Special Requests Normal  Final   Culture NO GROWTH 5 DAYS  Final   Report Status 06/07/2014 FINAL  Final  Urine culture     Status: None (Preliminary result)   Collection Time: 06/14/14 10:21 PM  Result Value Ref Range Status   Specimen Description URINE, CATHETERIZED  Final   Special Requests NONE  Final   Culture TOO YOUNG TO READ  Final   Report Status PENDING  Incomplete    Medical History: Past Medical History  Diagnosis Date  . Hypertension   . A-fib   . Dementia   . Arthritis   . Cataract   . Chronic indwelling Foley catheter   . Recurrent UTI     Medications:  Scheduled:  . aspirin EC  81 mg Oral Daily  . digoxin  0.125 mg Oral QODAY  . heparin  5,000 Units Subcutaneous 3 times per day  . isosorbide mononitrate  120 mg Oral Daily  . levothyroxine  50 mcg Oral QAC breakfast  . meropenem (MERREM) IV   500 mg Intravenous Q24H  . metoprolol  50 mg Oral BID  . oxybutynin  5 mg Oral TID  . pantoprazole  40 mg Oral Daily  . QUEtiapine  25 mg Oral q morning - 10a  . QUEtiapine  50 mg Oral QHS  . simvastatin  40 mg Oral QHS   Assessment:  CrCl = 8.9  Goal of Therapy:    Plan:  Expected duration 7 days with resolution of temperature and/or normalization of WBC  Will change to Meropenem 500 mg IV Q24 hours  Zarian Colpitts D 06/15/2014,11:09 AM

## 2014-06-15 NOTE — Consult Note (Signed)
Palliative Medicine Inpatient Consult Note   Name: Gabriel Valencia Date: 06/15/2014 MRN: [id number]  DOB: 1919-01-28  Referring Physician: Marguarite Arbour, MD  Palliative Care consult requested for this 81 y.o. male for goals of medical therapy in patient with Alzheimer's Dementia, BPH with chronic foley catheter, recurrent UTI hx, h/o hematuria, and a-fib. Pt presented to ER from [facility] for AMS. ER workup significant for urosepsis. Pt admitted to [facility] for evaluation and treatment.  Family not at bedside  Pt currently resting in bed and not responsive to stimuli.   REVIEW OF SYSTEMS:  Patient is not able to provide ROS  SPIRITUAL SUPPORT SYSTEM: Yes.  SOCIAL HISTORY:  reports that he has been smoking.  He does not have any smokeless tobacco history on file. He reports that he does not drink alcohol or use illicit drugs.  LEGAL DOCUMENTS:  Advance Directives:  Yes. Health Care Power of Attorney:  Yes.  CODE STATUS: DNR  PAST MEDICAL HISTORY: Past Medical History  Diagnosis Date  . Hypertension   . A-fib   . Dementia   . Arthritis   . Cataract   . Chronic indwelling Foley catheter   . Recurrent UTI     PAST SURGICAL HISTORY:  Past Surgical History  Procedure Laterality Date  . Cholecystectomy      ALLERGIES:  has No Known Allergies.  MEDICATIONS:  Current Facility-Administered Medications  Medication Dose Route Frequency Provider Last Rate Last Dose  . 0.9 %  sodium chloride infusion   Intravenous Continuous Milagros Loll, MD 100 mL/hr at 06/15/14 7829    . acetaminophen (TYLENOL) tablet 650 mg  650 mg Oral Q6H PRN Milagros Loll, MD      . aspirin EC tablet 81 mg  81 mg Oral Daily Srikar Sudini, MD      . bisacodyl (DULCOLAX) EC tablet 5 mg  5 mg Oral Daily PRN Srikar Sudini, MD      . digoxin (LANOXIN) tablet 0.125 mg  0.125 mg Oral QODAY Srikar Sudini, MD      . haloperidol (HALDOL) tablet 5 mg  5 mg Oral TID PRN Milagros Loll, MD      . heparin injection  5,000 Units  5,000 Units Subcutaneous 3 times per day Milagros Loll, MD   5,000 Units at 06/15/14 0556  . hydrALAZINE (APRESOLINE) injection 10 mg  10 mg Intravenous Q6H PRN Srikar Sudini, MD      . HYDROcodone-acetaminophen (NORCO/VICODIN) 5-325 MG per tablet 1-2 tablet  1-2 tablet Oral Q4H PRN Srikar Sudini, MD      . isosorbide mononitrate (IMDUR) 24 hr tablet 120 mg  120 mg Oral Daily Srikar Sudini, MD      . levothyroxine (SYNTHROID, LEVOTHROID) tablet 50 mcg  50 mcg Oral QAC breakfast Srikar Sudini, MD      . magnesium hydroxide (MILK OF MAGNESIA) suspension 30 mL  30 mL Oral Daily PRN Srikar Sudini, MD      . meropenem (MERREM) 500 mg in sodium chloride 0.9 % 50 mL IVPB  500 mg Intravenous Q24H Marguarite Arbour, MD      . metoprolol (LOPRESSOR) tablet 50 mg  50 mg Oral BID Milagros Loll, MD   50 mg at 06/15/14 0158  . nitroGLYCERIN (NITROSTAT) SL tablet 0.4 mg  0.4 mg Sublingual Q5 min PRN Srikar Sudini, MD      . ondansetron (ZOFRAN) tablet 4 mg  4 mg Oral Q6H PRN Milagros Loll, MD       Or  .  ondansetron (ZOFRAN) injection 4 mg  4 mg Intravenous Q6H PRN Srikar Sudini, MD      . oxybutynin (DITROPAN) tablet 5 mg  5 mg Oral TID Srikar Sudini, MD      . pantoprazole (PROTONIX) EC tablet 40 mg  40 mg Oral Daily Srikar Sudini, MD      . QUEtiapine (SEROQUEL) tablet 25 mg  25 mg Oral q morning - 10a Srikar Sudini, MD      . QUEtiapine (SEROQUEL) tablet 50 mg  50 mg Oral QHS Milagros Loll, MD   50 mg at 06/15/14 0158  . simvastatin (ZOCOR) tablet 40 mg  40 mg Oral QHS Milagros Loll, MD   40 mg at 06/15/14 0159    Vital Signs: BP 180/92 mmHg  Pulse 98  Temp(Src) 98.7 F (37.1 C) (Oral)  Resp 19  Ht 6' (1.829 m)  Wt 60.555 kg (133 lb 8 oz)  BMI 18.10 kg/m2  SpO2 97% Filed Weights   06/14/14 1713 06/15/14 0101  Weight: 64.411 kg (142 lb) 60.555 kg (133 lb 8 oz)    Estimated body mass index is 18.1 kg/(m^2) as calculated from the following:   Height as of this encounter: 6' (1.829  m).   Weight as of this encounter: 60.555 kg (133 lb 8 oz).  PERFORMANCE STATUS (ECOG) : 2 - Symptomatic, <50% confined to bed  PHYSICAL EXAM: General appearance: critically ill appearing Head: Normocephalic, without obvious abnormality, atraumatic Neck: supple, symmetrical, trachea midline and thyroid not enlarged, symmetric, no tenderness/mass/nodules Resp: clear to auscultation bilaterally Cardio: regular rate a-fib noted GI: soft, non-tender; bowel sounds normal; no masses, no organomegaly Genitalia: foley catheter present, no trauma noted Extremities: extremities normal, atraumatic, no cyanosis or edema Neurologic: Grossly normal  LABS: CBC:    Component Value Date/Time   WBC 24.8* 06/15/2014 0510   WBC 5.6 05/18/2014 1950   HGB 11.9* 06/15/2014 0510   HGB 12.3* 05/18/2014 1950   HCT 36.4* 06/15/2014 0510   HCT 36.9* 05/18/2014 1950   PLT 137* 06/15/2014 0510   PLT 106* 05/18/2014 1950   MCV 94.2 06/15/2014 0510   MCV 93 05/18/2014 1950   NEUTROABS 4.2 06/05/2014 0712   NEUTROABS 4.1 05/18/2014 1950   LYMPHSABS 0.9* 06/05/2014 0712   LYMPHSABS 0.9* 05/18/2014 1950   MONOABS 0.4 06/05/2014 0712   MONOABS 0.5 05/18/2014 1950   EOSABS 0.1 06/05/2014 0712   EOSABS 0.1 05/18/2014 1950   BASOSABS 0.0 06/05/2014 0712   BASOSABS 0.0 05/18/2014 1950   Comprehensive Metabolic Panel:    Component Value Date/Time   NA 137 06/15/2014 0510   NA 139 05/18/2014 1950   K 4.7 06/15/2014 0510   K 4.9 05/18/2014 1950   CL 104 06/15/2014 0510   CL 104 05/18/2014 1950   CO2 24 06/15/2014 0510   CO2 32 05/18/2014 1950   BUN 66* 06/15/2014 0510   BUN 29* 05/18/2014 1950   CREATININE 4.46* 06/15/2014 0510   CREATININE 0.96 05/18/2014 1950   GLUCOSE 108* 06/15/2014 0510   GLUCOSE 111* 05/18/2014 1950   CALCIUM 8.6* 06/15/2014 0510   CALCIUM 8.2* 05/18/2014 1950   AST 14* 06/14/2014 1818   AST 47* 05/18/2014 1950   ALT 12* 06/14/2014 1818   ALT 15* 05/18/2014 1950   ALKPHOS  66 06/14/2014 1818   ALKPHOS 58 05/18/2014 1950   BILITOT 1.0 06/14/2014 1818   PROT 6.8 06/14/2014 1818   PROT 5.5* 05/18/2014 1950   ALBUMIN 3.3* 06/14/2014 1818   ALBUMIN 3.1*  05/18/2014 1950    IMPRESSION:  Palliative Care consult requested for this 81 y.o. male for goals of medical therapy in patient with Alzheimer's Dementia, BPH with chronic foley catheter, recurrent UTI hx, h/o hematuria, and a-fib. Pt [facility] for [facility]MS. ER workup significant for urosepsis. [facility] for evaluation and treatment.  Family not at bedside  Pt resting in bed and non responsive to stimuli.  Pt is critically ill appearing.  Family not at bedside and will meet when they arrive to discuss goals of care.   PLAN: 1. DNR    More than 50% of the visit was spent in counseling/coordination of care: Yes  Time Spent: 75minutes

## 2014-06-15 NOTE — Progress Notes (Signed)
Nutrition Brief Note  Chart reviewed secondary to malnutrition screening tool score of 3. Pt now transitioning to comfort care.  No further nutrition interventions warranted at this time.  Please re-consult as needed.     Maguadalupe Lata B. Freida BusmanAllen, RD, LDN [phone] (pager)

## 2014-06-15 NOTE — Consult Note (Signed)
Wife at bedside and agrees to goals of care discussion.  Wife states she wants comfort measures at this time only.  Explained comfort measures and d/c of antibiotics and IV fluids.  Wife in agreement.  Will place orders and transfer to 1C.    Spoke with wife about comfort measures in home, back at ALF is possible, hospice home and [facility].  Wife would like to keep pt here and reassess tomorrow morning.  If pt is transferrable would consider hospice home.

## 2014-06-15 NOTE — Plan of Care (Signed)
Problem: Discharge Progression Outcomes Goal: Discharge plan in place and appropriate Individualization: Gabriel Valencia transferred from [facility] on comfort care.  Wife will stay at bedside tomorrow.   He'll be reevaluated tomorrow for possible transfer to Hospice.  Hx of Alzheimers, BPH - chronic foley for this w/recurrent UTI's. In kidney failure - very little urine output.  Bladder distended. Found to have multiple cysts on both kidneys.

## 2014-06-15 NOTE — Care Management (Signed)
Consult received, review of case. Patient may be hospice home appropriate. Discussed with CSW  in progression rounds.Pallative care consult has been placed, awaiting consult. Continue to follow case.

## 2014-06-15 NOTE — Progress Notes (Signed)
Gabriel Valencia is a 81 y.o. male  ARF  SUBJECTIVE:  Pt admitted with ARF and presumed recurrent UTI with failure to thrive. Confused, not eating. Wife states pt just "shut down".  ______________________________________________________________________  ROS: Review of systems is unremarkable for any active cardiac,respiratory, GI, GU, hematologic, neurologic or psychiatric systems, 10 systems reviewed.  @CMEDLIST @  Past Medical History  Diagnosis Date  . Hypertension   . A-fib   . Dementia   . Arthritis   . Cataract   . Chronic indwelling Foley catheter   . Recurrent UTI     Past Surgical History  Procedure Laterality Date  . Cholecystectomy      PHYSICAL EXAM:  BP 163/106 mmHg  Pulse 107  Temp(Src) 98.3 F (36.8 C) (Oral)  Resp 19  Ht 6' (1.829 m)  Wt 60.555 kg (133 lb 8 oz)  BMI 18.10 kg/m2  SpO2 98%  Wt Readings from Last 3 Encounters:  06/15/14 60.555 kg (133 lb 8 oz)  06/02/14 62.188 kg (137 lb 1.6 oz)  05/26/14 65.318 kg (144 lb)            Constitutional: NAD Neck: supple, no thyromegaly Respiratory: scattered rhonchi Cardiovascular: irregularly irregular, no murmur, no gallop Abdomen: soft, good BS, nontender Extremities: no edema Neuro: alert and oriented, no focal motor or sensory deficits  ASSESSMENT/PLAN:  Labs and imaging studies were reviewed  Prognosis poor/terminal. Will cont. IV ABX for now. Consult PC and Hospice. I feel Hospice Home is the best option. F/u labs in AM. Pt is DNR.

## 2014-06-15 NOTE — Progress Notes (Signed)
Report called to 1C. Spoke with Corrie DandyMary, RN. Pt resting comfortably. Family at bedside. Continue to assess.

## 2014-06-16 LAB — URINE CULTURE

## 2014-06-16 MED ORDER — GLYCOPYRROLATE 0.2 MG/ML IJ SOLN
0.2000 mg | INTRAMUSCULAR | Status: DC
Start: 2014-06-16 — End: 2014-06-16
  Administered 2014-06-16: 0.2 mg via INTRAVENOUS
  Filled 2014-06-16: qty 1

## 2014-06-16 MED ORDER — ONDANSETRON 4 MG PO TBDP: 4.0000 mg | ORAL_TABLET | Freq: Three times a day (TID) | ORAL | Status: DC | PRN

## 2014-06-16 MED ORDER — LORAZEPAM 1 MG PO TABS: 1.0000 mg | ORAL_TABLET | ORAL | Status: AC | PRN

## 2014-06-16 MED ORDER — MORPHINE SULFATE 20 MG/5ML PO SOLN: 5.0000 mg | ORAL | Status: AC | PRN

## 2014-06-16 MED ORDER — ONDANSETRON 4 MG PO TBDP: 4.0000 mg | ORAL_TABLET | Freq: Three times a day (TID) | ORAL | Status: AC | PRN

## 2014-06-16 MED ORDER — MORPHINE SULFATE (CONCENTRATE) 10 MG /0.5 ML PO SOLN: 10.0000 mg | ORAL | Status: AC | PRN

## 2014-06-16 MED ORDER — ONDANSETRON HCL 4 MG PO TABS: 4.0000 mg | ORAL_TABLET | Freq: Four times a day (QID) | ORAL | Status: AC | PRN

## 2014-06-16 NOTE — Clinical Social Work Note (Signed)
Pt is ready for discharge today and will go to [facility] via EMS. Hospital Liaison arranged discharge to [facility]. CSW prepared packet. CSW is signing off as no further needs identified.  Dede QuerySarah Salih Williamson, MSW, LCSW Clinical Social Worker  [phone]

## 2014-06-16 NOTE — Plan of Care (Signed)
Problem: Discharge Progression Outcomes Goal: Discharge plan in place and appropriate -  Gabriel Valencia transferred from [facility] on comfort care.  -  Hx of Alzheimers, BPH - chronic foley for this w/recurrent UTI's. -  In kidney failure - very little urine output.  Bladder distended. Found to have multiple cysts on both kidneys.     Goal: Other Discharge Outcomes/Goals Plan of care progress to goal for: sepsis - Continues comfort care, resting comfortable this shift. - No respiratory distress noted. - Wife at bedside. - Possible d/c to hospice today.

## 2014-06-16 NOTE — Consult Note (Signed)
Hospice Home Discharge Orders   Patient:  Gabriel PerchesOdell Valencia is an 81 y.o., male MRN:  [id number] DOB:  1919/10/16 Patient phone:  [phone] (home)  Patient address:   [address] [place] [id number],     Admit: Admit to Hospice Home  Code Status: DNR  Diet: as tolerated  Activity: as tolerated  Oxygen: use for comfort  Foley: Leave foley cath for urinary incontinence/previous skin breakdown   Date of Admission:  06/14/2014   Principal Problem: <principal problem not specified> Discharge Diagnoses: Patient Active Problem List   Diagnosis Date Noted  . Sepsis due to urinary tract infection [A41.9, N39.0]   . Sepsis [A41.9] 06/14/2014  . Frank hematuria [R31.0]   . Acute cystitis [N30.00] 06/02/2014  . Leukocytosis [D72.829] 06/02/2014  . ARF (acute renal failure) [N17.9] 06/02/2014  . Protein-calorie malnutrition, severe [E43] 05/27/2014  . Acute cystitis with hematuria [N30.01] 05/25/2014  . Hematuria [R31.9] 05/25/2014      Medications: May crush or use liquid when appropriate. May change to rectal route if unable to swallow.    Medication List    STOP taking these medications        aspirin EC 81 MG tablet     digoxin 0.125 MG tablet  Commonly known as:  LANOXIN     isosorbide mononitrate 120 MG 24 hr tablet  Commonly known as:  IMDUR     levothyroxine 50 MCG tablet  Commonly known as:  SYNTHROID, LEVOTHROID     loratadine 10 MG tablet  Commonly known as:  CLARITIN     magnesium hydroxide 400 MG/5ML suspension  Commonly known as:  MILK OF MAGNESIA     metoprolol 50 MG tablet  Commonly known as:  LOPRESSOR     nitroGLYCERIN 0.4 MG SL tablet  Commonly known as:  NITROSTAT     omeprazole 20 MG capsule  Commonly known as:  PRILOSEC     oxybutynin 5 MG tablet  Commonly known as:  DITROPAN     QUEtiapine 25 MG tablet  Commonly known as:  SEROQUEL     simvastatin 40 MG tablet  Commonly known as:  ZOCOR      TAKE these medications         acetaminophen 325 MG tablet  Commonly known as:  TYLENOL  Take 2 tablets (650 mg total) by mouth every 6 (six) hours as needed for mild pain (or Fever >/= 101).     bisacodyl 5 MG EC tablet  Commonly known as:  DULCOLAX  Take 1 tablet (5 mg total) by mouth daily as needed for moderate constipation.     haloperidol 5 MG tablet  Commonly known as:  HALDOL  Take 5 mg by mouth 3 (three) times daily as needed for agitation.     LORazepam 1 MG tablet  Commonly known as:  ATIVAN  Take 1 tablet (1 mg total) by mouth every hour as needed for anxiety.     morphine CONCENTRATE 10 mg / 0.5 ml concentrated solution  Take 0.5 mLs (10 mg total) by mouth every 2 (two) hours as needed for severe pain.     morphine 20 MG/5ML solution  Take 1.3 mLs (5.2 mg total) by mouth every hour as needed for pain.     ondansetron 4 MG disintegrating tablet  Commonly known as:  ZOFRAN-ODT  Take 1 tablet (4 mg total) by mouth every 8 (eight) hours as needed for nausea or vomiting.     ondansetron 4 MG tablet  Commonly known as:  ZOFRAN  Take 1 tablet (4 mg total) by mouth every 6 (six) hours as needed for nausea.           Comments:    SignedSteele Berg Raijon Lindfors 06/16/2014, 11:03 AM

## 2014-06-16 NOTE — Discharge Summary (Signed)
Gabriel Valencia, is a 81 y.o. male  DOB February 22, 1919  MRN [id number].  Admission date:  06/14/2014  Admitting Physician  Marguarite Arbour, MD  Discharge Date:  06/16/2014   Primary MD  Marguarite Arbour MD  Recommendations for primary care physician for things to follow:   Comfort Care   Admission Diagnosis  Somnolence [R40.0] ARF (acute renal failure) [N17.9] Acute renal failure [N17.9] Sepsis due to urinary tract infection [A41.9, N39.0] Acute renal failure, unspecified acute renal failure type [N17.9]   Discharge Diagnosis  Somnolence [R40.0] ARF (acute renal failure) [N17.9] Acute renal failure [N17.9] Sepsis due to urinary tract infection [A41.9, N39.0] Acute renal failure, unspecified acute renal failure type [N17.9]    Active Problems:   Sepsis   Sepsis due to urinary tract infection      Past Medical History  Diagnosis Date  . Hypertension   . A-fib   . Dementia   . Arthritis   . Cataract   . Chronic indwelling Foley catheter   . Recurrent UTI     Past Surgical History  Procedure Laterality Date  . Cholecystectomy         History of present illness and  Hospital Course:     Kindly see H&P for history of present illness and admission details, please review complete Labs, Consult reports and Test reports for all details in brief  HPI  from the history and physical done on the day of admission    Hospital Course    Pt admitted with ARF with failure to thrive. Seen by [facility] and made comfort care. Pt verbally unresponsive. Now transferred to [facility] for end of life care.   Discharge Condition: poor   Follow UP  Follow-up Information    Follow up with SPARKS,JEFFREY D, MD In 1 week.   Specialty:  Internal Medicine   Contact information:   [address] [place] [address] [phone]         Discharge Instructions  and  Discharge Medications    Comfort care     Medication List    STOP taking these medications        digoxin 0.125 MG tablet  Commonly known as:  LANOXIN     isosorbide mononitrate 120 MG 24 hr tablet  Commonly known as:  IMDUR     levothyroxine 50 MCG tablet  Commonly known as:  SYNTHROID, LEVOTHROID     loratadine 10 MG tablet  Commonly known as:  CLARITIN     magnesium hydroxide 400 MG/5ML suspension  Commonly known as:  MILK OF MAGNESIA     metoprolol 50 MG tablet  Commonly known as:  LOPRESSOR     nitroGLYCERIN 0.4 MG SL tablet  Commonly known as:  NITROSTAT     omeprazole 20 MG capsule  Commonly known as:  PRILOSEC     oxybutynin 5 MG tablet  Commonly known as:  DITROPAN     QUEtiapine 25 MG tablet  Commonly known as:  SEROQUEL  simvastatin 40 MG tablet  Commonly known as:  ZOCOR      TAKE these medications        acetaminophen 325 MG tablet  Commonly known as:  TYLENOL  Take 2 tablets (650 mg total) by mouth every 6 (six) hours as needed for mild pain (or Fever >/= 101).     bisacodyl 5 MG EC tablet  Commonly known as:  DULCOLAX  Take 1 tablet (5 mg total) by mouth daily as needed for moderate constipation.     haloperidol 5 MG tablet  Commonly known as:  HALDOL  Take 5 mg by mouth 3 (three) times daily as needed for agitation.     morphine CONCENTRATE 10 mg / 0.5 ml concentrated solution  Take 0.5 mLs (10 mg total) by mouth every 2 (two) hours as needed for severe pain.     ondansetron 4 MG tablet  Commonly known as:  ZOFRAN  Take 1 tablet (4 mg total) by mouth every 6 (six) hours as needed for nausea.      ASK your doctor about these medications        aspirin EC 81 MG tablet  Take 81 mg by mouth daily.          Diet and Activity recommendation: See Discharge Instructions above   Consults obtained - PC   Major procedures and Radiology Reports - PLEASE review detailed and final reports for all details, in brief -   See below   Dg Abd 1  View  05/26/2014   CLINICAL DATA:  82 year old male with hematuria and abdominal pain. Initial encounter.  EXAM: ABDOMEN - 1 VIEW  COMPARISON:  Report of [facility] abdomen series 10/29/2013 (no images available). [facility] CT Abdomen and Pelvis 04/05/2012, and earlier.  FINDINGS: Portable AP view of the abdomen and pelvis at 0830 hours. A catheter projects over the central pelvis. Non obstructed bowel gas pattern. Stable cholecystectomy clips. No nephrolithiasis. Small pelvic phleboliths appear stable. No definite urologic calculus. Chronic degenerative changes in the spine.  IMPRESSION: Non obstructed bowel gas pattern and no definite urologic calculus identified.  Bladder catheter versus rectal tube projects over the pelvis.   Electronically Signed   By: Odessa Fleming M.D.   On: 05/26/2014 08:41   US Renal  06/15/2014   CLINICAL DATA:  Acute renal failure.  Hypertension.  EXAM: RENAL / URINARY TRACT ULTRASOUND COMPLETE  COMPARISON:  CT of the abdomen and pelvis 04/05/2012  FINDINGS: Right Kidney:  Length: At least 12.4 cm in length. Multiple cysts are identified. Largest cyst on the right is 9.9 x 6.5 x 9.3 cm. Upper pole cyst is 2.2 x 2.7 x 2.3 cm. Lower pole cyst is 3.6 x 3.4 x 3.7 cm  Left Kidney:  Length: At least 11.8 cm. Lower pole cyst is 2.2 x 1.8 x 1.8 cm. There is mild left-sided hydronephrosis.  Bladder:  Echogenic debris identified within the dependent aspect of the bladder. Bladder volume is estimated to be 1038.5 cc. Despite the presence of a Foley catheter, catheter is not visible by ultrasound and may be within the urethra.  IMPRESSION: 1. Multiple bilateral renal cysts. No suspicious renal lesions identified. 2. Mild left-sided hydronephrosis. 3. Distended urinary bladder containing debris. Suspect that the Foley catheter is not adequately positioned to drain the bladder. Foley catheter is not visible on ultrasound evaluation of the bladder. The salient findings were  discussed with Dr. Judithann Sheen on 06/15/2014 at 10:10 am.   Electronically Signed  By: Norva PavlovElizabeth  Brown M.D.   On: 06/15/2014 10:10    Micro Results   See below  Recent Results (from the past 240 hour(s))  Culture, blood (routine x 2)     Status: None (Preliminary result)   Collection Time: 06/14/14  9:06 PM  Result Value Ref Range Status   Specimen Description BLOOD  Final   Special Requests NONE  Final   Culture  Setup Time   Final    GRAM POSITIVE COCCI IN CHAINS IN BOTH AEROBIC AND ANAEROBIC BOTTLES CRITICAL VALUE NOTED.  VALUE IS CONSISTENT WITH PREVIOUSLY REPORTED AND CALLED VALUE.    Culture   Final    GRAM POSITIVE COCCI IN CHAINS IN BOTH AEROBIC AND ANAEROBIC BOTTLES IDENTIFICATION TO FOLLOW    Report Status PENDING  Incomplete  Culture, blood (routine x 2)     Status: None (Preliminary result)   Collection Time: 06/14/14  9:11 PM  Result Value Ref Range Status   Specimen Description BLOOD  Final   Special Requests NONE  Final   Culture  Setup Time   Final    GRAM POSITIVE COCCI IN CHAINS IN BOTH AEROBIC AND ANAEROBIC BOTTLES CRITICAL RESULT CALLED TO, READ BACK BY AND VERIFIED WITH: ZANITA HARRIS AT 1320 06/15/14 CTJ    Culture   Final    GRAM POSITIVE COCCI IN CHAINS IN BOTH AEROBIC AND ANAEROBIC BOTTLES IDENTIFICATION TO FOLLOW    Report Status PENDING  Incomplete  Urine culture     Status: None (Preliminary result)   Collection Time: 06/14/14 10:21 PM  Result Value Ref Range Status   Specimen Description URINE, CATHETERIZED  Final   Special Requests NONE  Final   Culture TOO YOUNG TO READ  Final   Report Status PENDING  Incomplete       Today   Subjective:   Gabriel PerchesOdell Valencia is lying in bed, verbally unresponsive with tachypnea. Family at bedside. Now comfort care for end of life issues.  Objective:   Blood pressure 180/92, pulse 98, temperature 98.7 F (37.1 C), temperature source Oral, resp. rate 19, height 6' (1.829 m), weight 60.555 kg (133 lb 8  oz), SpO2 97 %.   Intake/Output Summary (Last 24 hours) at 06/16/14 0711 Last data filed at 06/15/14 0900  Gross per 24 hour  Intake      0 ml  Output      0 ml  Net      0 ml    Exam Awake No new F.N deficits,  [place].AT,PERRAL Supple Neck,No JVD, No cervical lymphadenopathy appriciated.  Symmetrical Chest wall movement, scattered rhonchi Irregularly irregular,No Gallops,Rubs or new Murmurs, No Parasternal Heave +ve B.Sounds, Abd Soft,  tender, No organomegaly appriciated, No rebound or rigidity. Guarding present No Cyanosis, Clubbing or edema, No new Rash or bruise  Data Review   CBC w Diff: Lab Results  Component Value Date   WBC 24.8* 06/15/2014   WBC 5.6 05/18/2014   HGB 11.9* 06/15/2014   HGB 12.3* 05/18/2014   HCT 36.4* 06/15/2014   HCT 36.9* 05/18/2014   PLT 137* 06/15/2014   PLT 106* 05/18/2014   LYMPHOPCT 16 06/05/2014   LYMPHOPCT 15.4 05/18/2014   MONOPCT 7 06/05/2014   MONOPCT 9.3 05/18/2014   EOSPCT 2 06/05/2014   EOSPCT 1.4 05/18/2014   BASOPCT 1 06/05/2014   BASOPCT 0.3 05/18/2014    CMP: Lab Results  Component Value Date   NA 137 06/15/2014   NA 139 05/18/2014   K 4.7 06/15/2014  K 4.9 05/18/2014   CL 104 06/15/2014   CL 104 05/18/2014   CO2 24 06/15/2014   CO2 32 05/18/2014   BUN 66* 06/15/2014   BUN 29* 05/18/2014   CREATININE 4.46* 06/15/2014   CREATININE 0.96 05/18/2014   PROT 6.8 06/14/2014   PROT 5.5* 05/18/2014   ALBUMIN 3.3* 06/14/2014   ALBUMIN 3.1* 05/18/2014   BILITOT 1.0 06/14/2014   ALKPHOS 66 06/14/2014   ALKPHOS 58 05/18/2014   AST 14* 06/14/2014   AST 47* 05/18/2014   ALT 12* 06/14/2014   ALT 15* 05/18/2014  .   Total Time in preparing paper work, data evaluation and todays exam - 45 minutes  SPARKS,JEFFREY D M.D on 06/16/2014 at 7:11 AM

## 2014-06-16 NOTE — Consult Note (Signed)
Palliative Medicine Inpatient Consult Follow Up Note   Name: Gabriel Valencia Date: 06/16/2014 MRN: [id number]  DOB: 09/02/1919  Referring Physician: Marguarite Arbour, MD  Palliative Care consult requested for this 81 y.o. male for goals of medical therapy in patient with Alzheimer's dementia, BPH with chronic foley catheter, recurrent UTI/hematuria, admitted with sepsis/UTI  Gabriel Valencia is lying in bed. Now comfort care only. Plan is for transfer to Hospice Home today.     REVIEW OF SYSTEMS:  Patient is not able to provide ROS  CODE STATUS: DNR   PAST MEDICAL HISTORY: Past Medical History  Diagnosis Date  . Hypertension   . A-fib   . Dementia   . Arthritis   . Cataract   . Chronic indwelling Foley catheter   . Recurrent UTI     PAST SURGICAL HISTORY:  Past Surgical History  Procedure Laterality Date  . Cholecystectomy      Vital Signs: BP 180/92 mmHg  Pulse 98  Temp(Src) 100.1 F (37.8 C) (Axillary)  Resp 19  Ht 6' (1.829 m)  Wt 60.555 kg (133 lb 8 oz)  BMI 18.10 kg/m2  SpO2 97% Filed Weights   06/14/14 1713 06/15/14 0101  Weight: 64.411 kg (142 lb) 60.555 kg (133 lb 8 oz)    Estimated body mass index is 18.1 kg/(m^2) as calculated from the following:   Height as of this encounter: 6' (1.829 m).   Weight as of this encounter: 60.555 kg (133 lb 8 oz).  PHYSICAL EXAM: General appearance: critically ill appearing Head: OP clear Neck: trachea midline Resp: coarse BS's ant fields with audible UAW sounds Cardiovascular: irregular rate and rhythm GI: soft, non-tender, hypoactive BS's GU: foley catheter present Extremities: no edema Neurologic: unable to assess, sedated LABS: CBC:    Component Value Date/Time   WBC 24.8* 06/15/2014 0510   WBC 5.6 05/18/2014 1950   HGB 11.9* 06/15/2014 0510   HGB 12.3* 05/18/2014 1950   HCT 36.4* 06/15/2014 0510   HCT 36.9* 05/18/2014 1950   PLT 137* 06/15/2014 0510   PLT 106* 05/18/2014 1950   MCV 94.2 06/15/2014 0510    MCV 93 05/18/2014 1950   NEUTROABS 4.2 06/05/2014 0712   NEUTROABS 4.1 05/18/2014 1950   LYMPHSABS 0.9* 06/05/2014 0712   LYMPHSABS 0.9* 05/18/2014 1950   MONOABS 0.4 06/05/2014 0712   MONOABS 0.5 05/18/2014 1950   EOSABS 0.1 06/05/2014 0712   EOSABS 0.1 05/18/2014 1950   BASOSABS 0.0 06/05/2014 0712   BASOSABS 0.0 05/18/2014 1950   Comprehensive Metabolic Panel:    Component Value Date/Time   NA 137 06/15/2014 0510   NA 139 05/18/2014 1950   K 4.7 06/15/2014 0510   K 4.9 05/18/2014 1950   CL 104 06/15/2014 0510   CL 104 05/18/2014 1950   CO2 24 06/15/2014 0510   CO2 32 05/18/2014 1950   BUN 66* 06/15/2014 0510   BUN 29* 05/18/2014 1950   CREATININE 4.46* 06/15/2014 0510   CREATININE 0.96 05/18/2014 1950   GLUCOSE 108* 06/15/2014 0510   GLUCOSE 111* 05/18/2014 1950   CALCIUM 8.6* 06/15/2014 0510   CALCIUM 8.2* 05/18/2014 1950   AST 14* 06/14/2014 1818   AST 47* 05/18/2014 1950   ALT 12* 06/14/2014 1818   ALT 15* 05/18/2014 1950   ALKPHOS 66 06/14/2014 1818   ALKPHOS 58 05/18/2014 1950   BILITOT 1.0 06/14/2014 1818   PROT 6.8 06/14/2014 1818   PROT 5.5* 05/18/2014 1950   ALBUMIN 3.3* 06/14/2014 1818   ALBUMIN 3.1*  05/18/2014 1950    IMPRESSION: Gabriel Valencia is a 81 yo man with PMH of dementia, atrial fibrillation, chronic foley catheter, recurrent UTI's and hematuria with recurrent admissions for urinary symptoms. He was admitted 06/14/14 with UTI/sepsis and ARF. After meeting with wife yesterday, pt is now comfort care.   Plan is for transfer to the Hospice Home today for end of life care. Will start glycopyrrolate for secretions. Continue morphine & lorazepam for pain/dyspnea/agitation as needed. Discussed with wife at bedside.   PLAN: Hospice Home  REFERRALS TO BE ORDERED:  Hospice   More than 50% of the visit was spent in counseling/coordination of care: YES  Time spent: 35 minutes

## 2014-06-16 NOTE — Care Management (Signed)
Patient resting comfortably in bed; daughter at bedside. Daughter states she is waiting for patient transfer to [facility]. [facility] and will assess.

## 2014-06-16 NOTE — Discharge Instructions (Signed)
Transfer to Hospice Home for terminal/comfort care

## 2014-06-16 NOTE — Progress Notes (Signed)
[facility] referral  Received from [facility]. 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 He has a PMH of:BPH, dementia, recurrent UTI, HTN, A-fib, arthritis and cataract. Patient was treated with IV antibiotics and has continued to decline, family has chosen to pursue comfort at the [facility]. Writer met with patient's wife Hoyle Sauer in the room, patient remained unresponsive through out visit. Hospice services, philosophy of and team approach to care at the Hospice home explained, with good understanding voiced by Hoyle Sauer. She shared that she and Mr. Hengel had been married for 20 years and that they "were the best years of my life". Emotional support offered. Consents signed and faxed to referral intake along with all other patient information. CSW, CM, RN and MD all aware of plan for discharge with portable DNR in place Report called to hospice home, EMS notified. Thank you for the opportunity to serve Mr. Schara and his family. Flo Shanks RN, BSN, [facility] [place], [facility] [phone] c

## 2014-06-17 LAB — CULTURE, BLOOD (ROUTINE X 2)

## 2014-06-19 LAB — CULTURE, BLOOD (ROUTINE X 2)

## 2014-06-24 DEATH — deceased

## 2015-07-26 IMAGING — CT CT HEAD WITHOUT CONTRAST
1 series · 16 of 30 positions shown, 20 images · non-contrast
Comparison: Head CT 06/15/2013.  MRI brain 06/30/2013.

CLINICAL DATA: Altered mental status with increased agitation
today. History of dementia Initial encounter.

EXAM:
CT HEAD WITHOUT CONTRAST
TECHNIQUE: Contiguous axial images were obtained from the base of the skull
through the vertex without intravenous contrast.

[Series 2: head wo · axial · 0.39mm/px · z∈[-90,+36]mm · 16 of 32 slices shown, 20 images]
[im 2/32  brain]
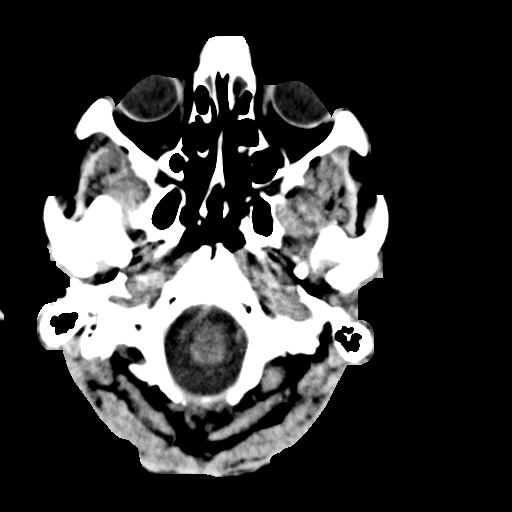
[im 2/32  bone]
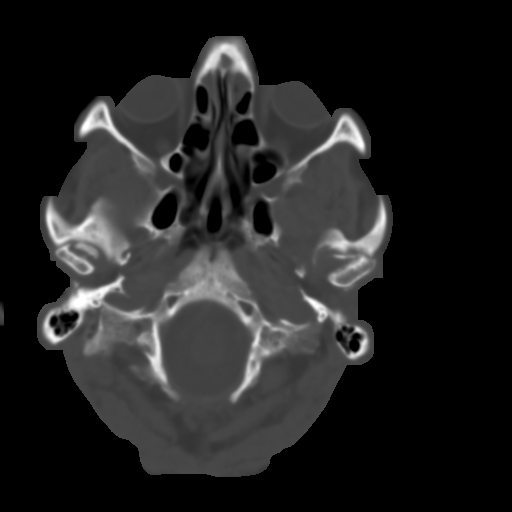
[im 4/32  brain]
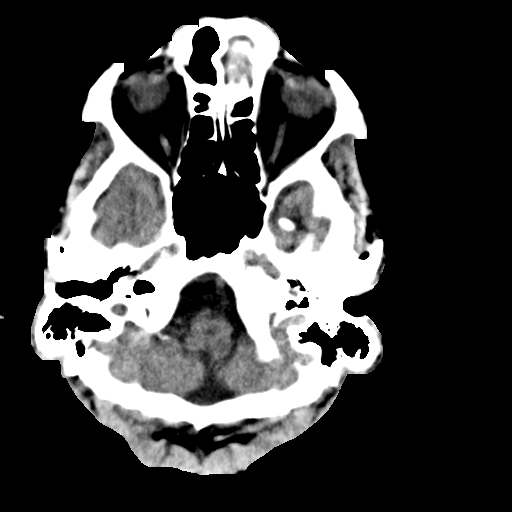
[im 6/32  brain]
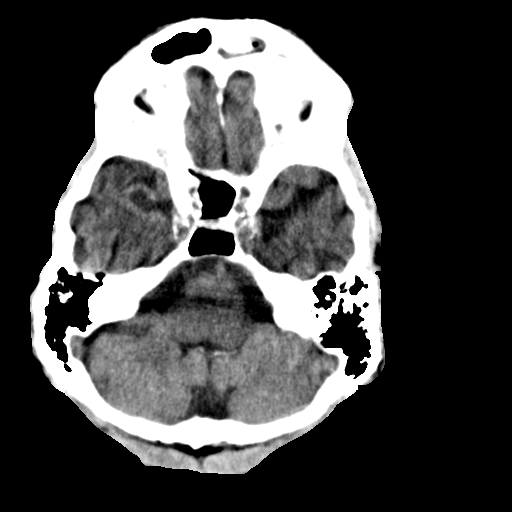
[im 8/32  brain]
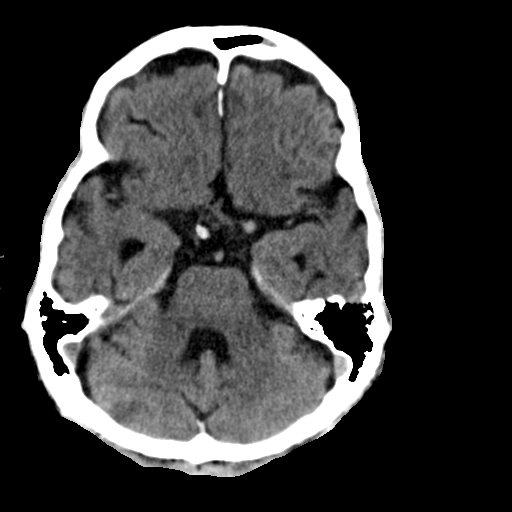
[im 9/32  brain]
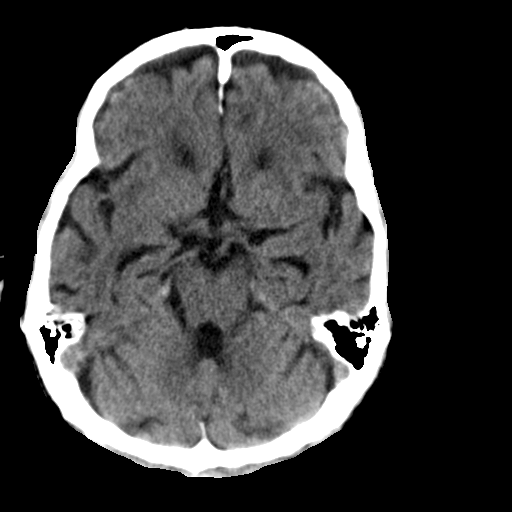
[im 9/32  bone]
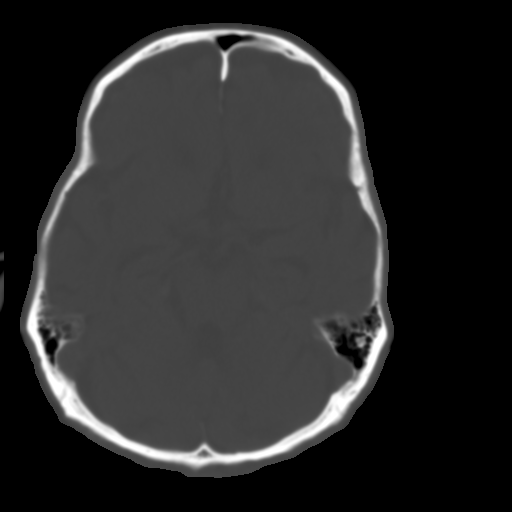
[im 11/32  brain]
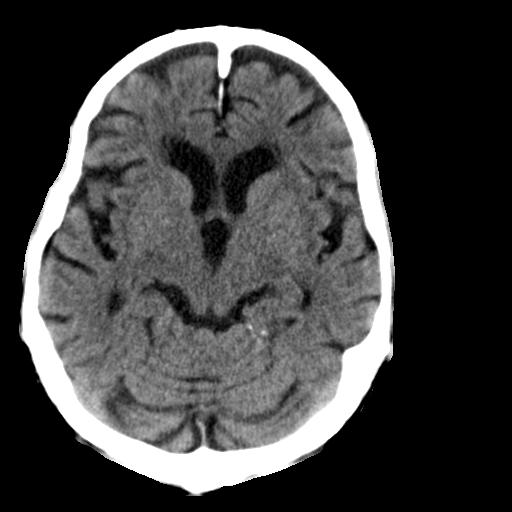
[im 13/32  brain]
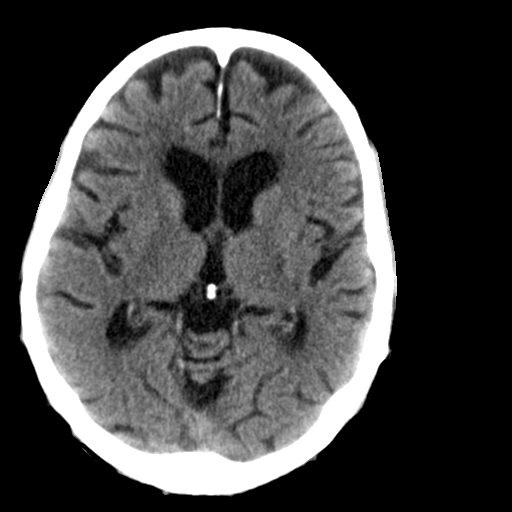
[im 15/32  brain]
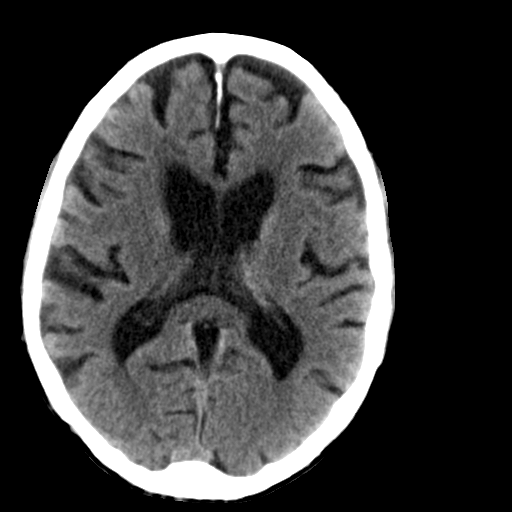
[im 17/32  brain]
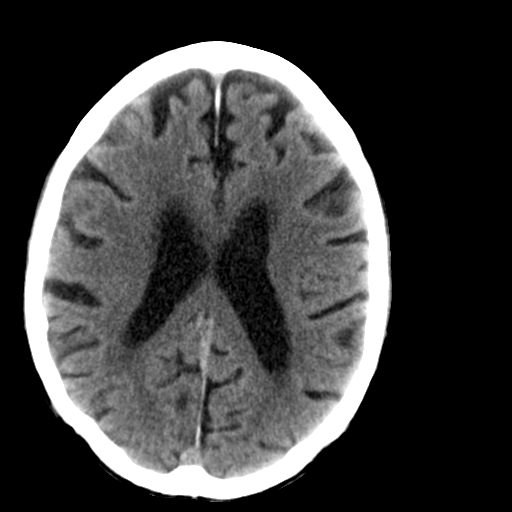
[im 17/32  bone]
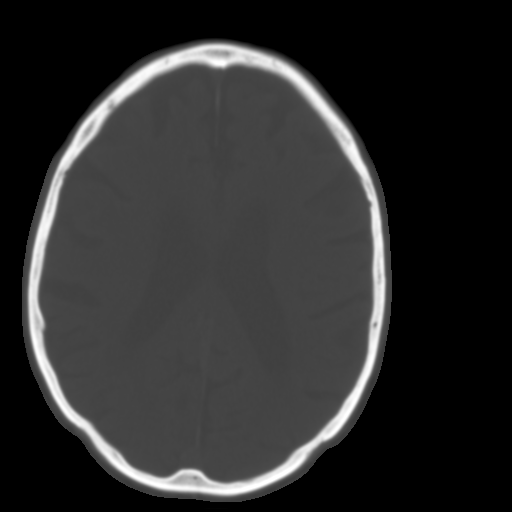
[im 19/32  brain]
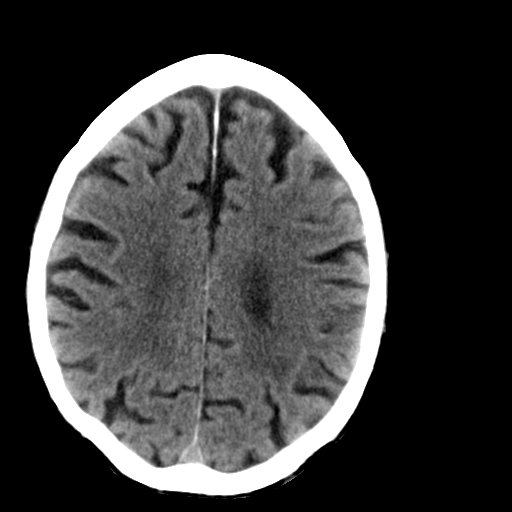
[im 21/32  brain]
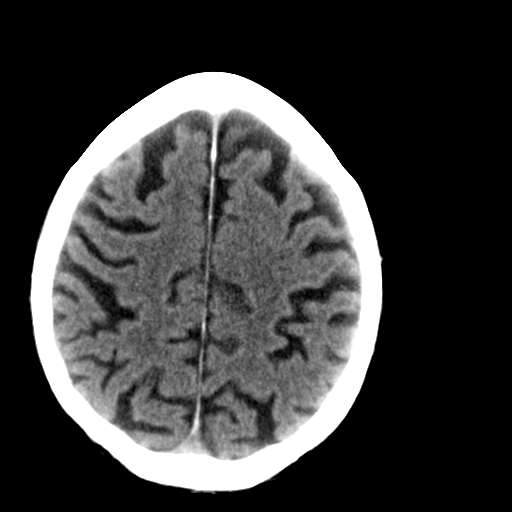
[im 23/32  brain]
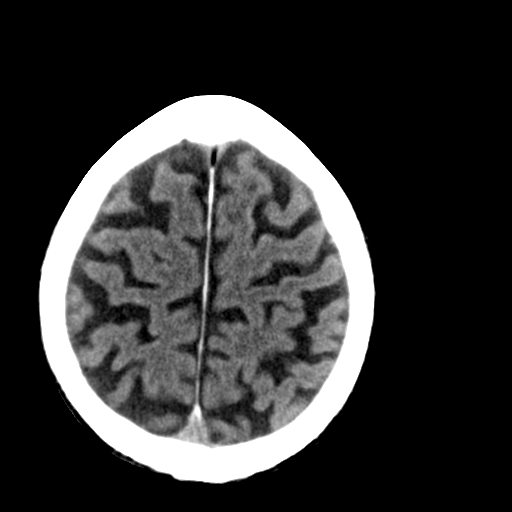
[im 24/32  brain]
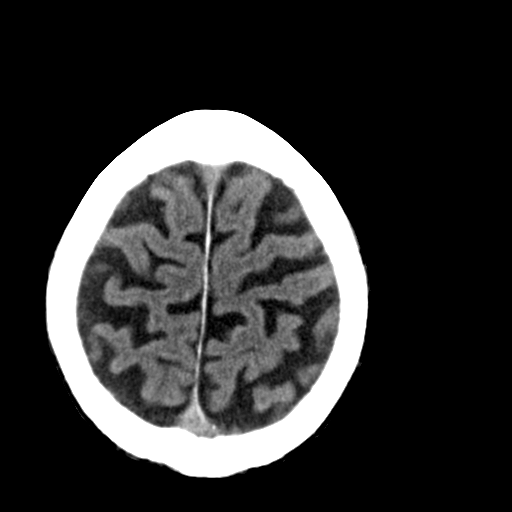
[im 24/32  bone]
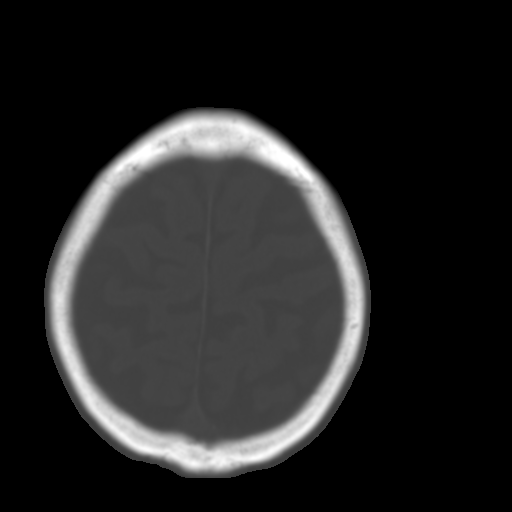
[im 26/32  brain]
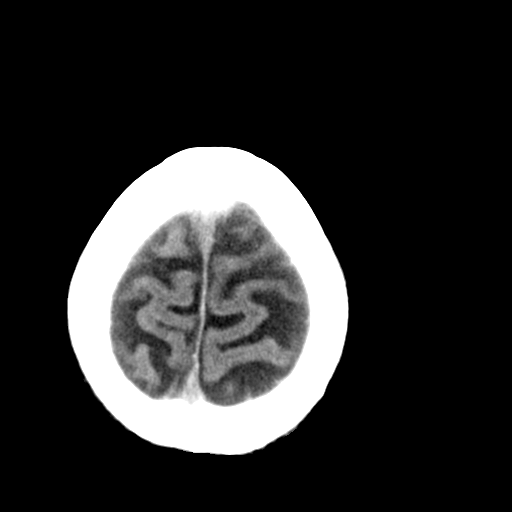
[im 28/32  brain]
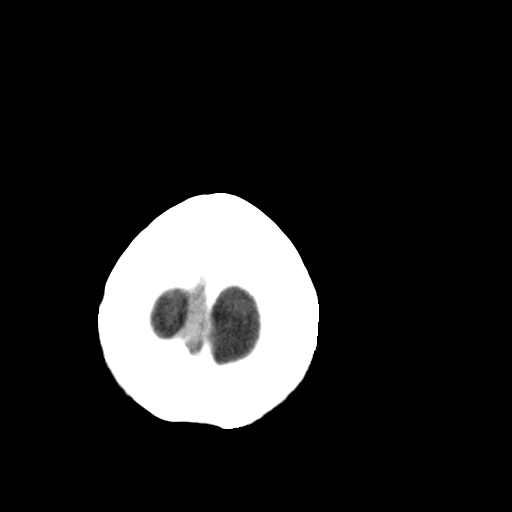
[im 30/32  brain]
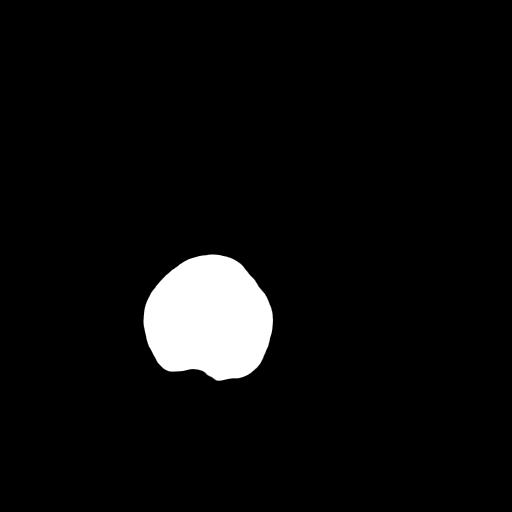

[16 of 30 positions shown; findings below may reference images not displayed]

FINDINGS: There is no evidence of acute intracranial hemorrhage, mass lesion,
brain edema or extra-axial fluid collection. The ventricles and
subarachnoid spaces are diffusely prominent but stable. There is
stable mild periventricular white matter disease. No acute infarct
identified.

There is chronic left frontal sinus opacification. The additional
visualized paranasal sinuses, mastoid air cells and middle ears are
clear. The calvarium is intact.
IMPRESSION: Stable atrophy, chronic small vessel ischemic changes in the
periventricular white matter and left frontal sinus disease. No
acute intracranial findings.

## 2015-11-24 IMAGING — US US RENAL
1 series · 14 of 25 positions shown · non-contrast
Comparison: CT of the abdomen and pelvis 04/05/2012

CLINICAL DATA: Acute renal failure.  Hypertension.

EXAM:
RENAL / URINARY TRACT ULTRASOUND COMPLETE

[Series 1: us renal · 0.25mm/px · 14 of 75 slices shown]
[im 1/75]
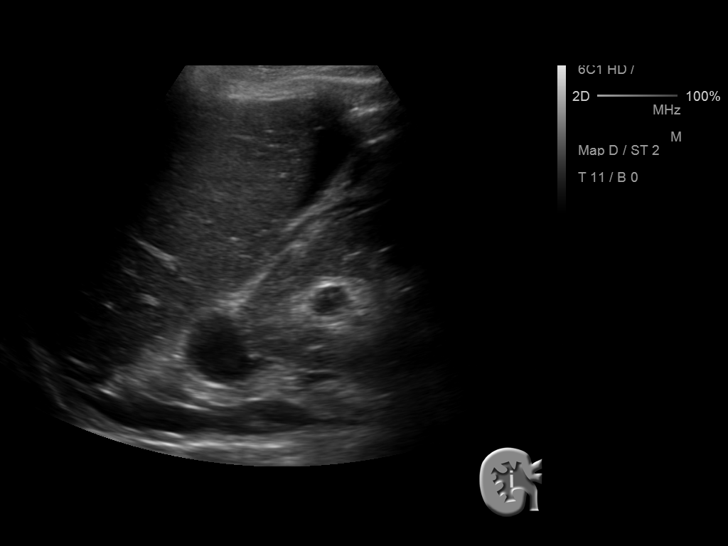
[im 7/75]
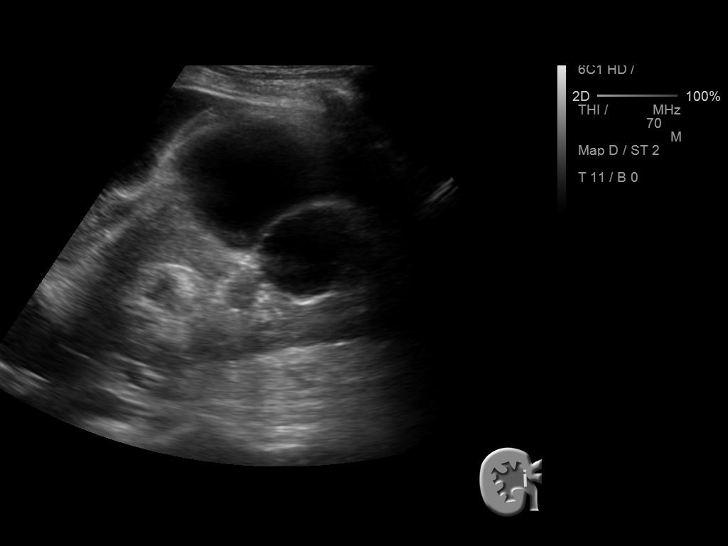
[im 13/75]
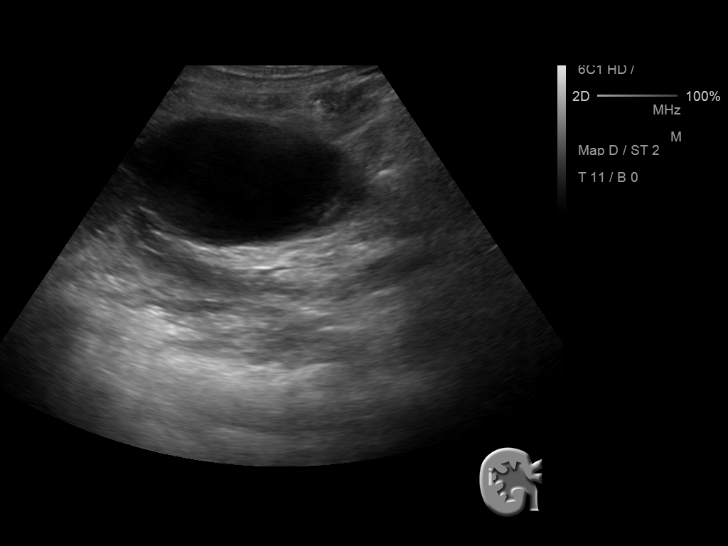
[im 19/75]
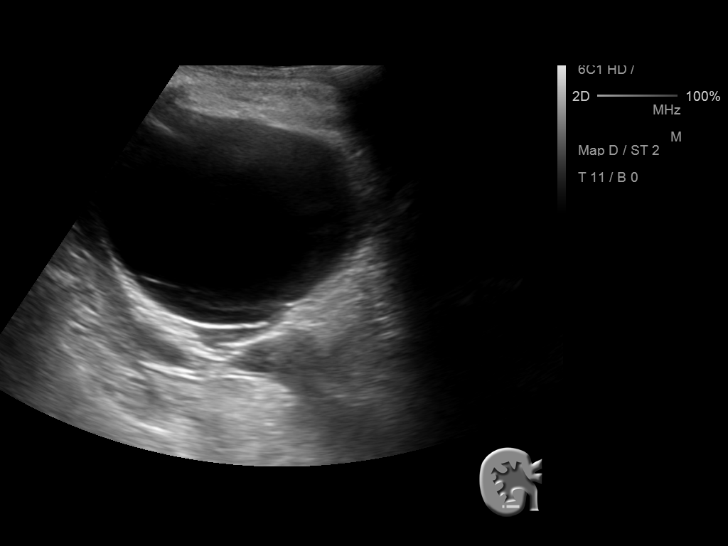
[im 25/75]
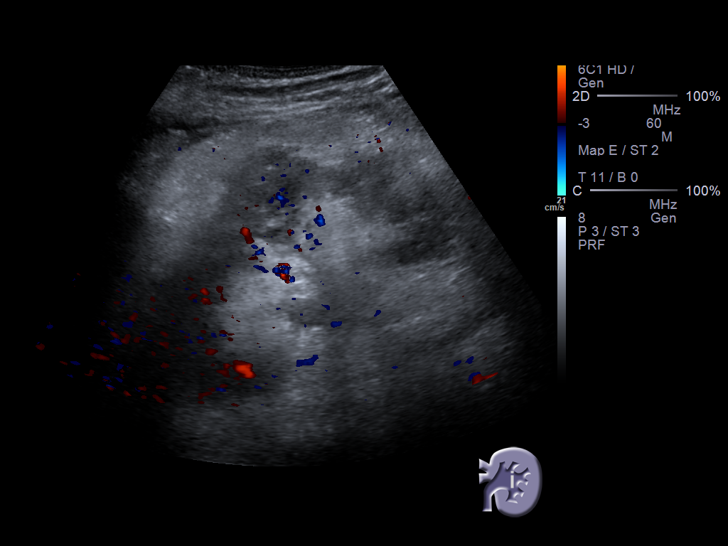
[im 28/75]
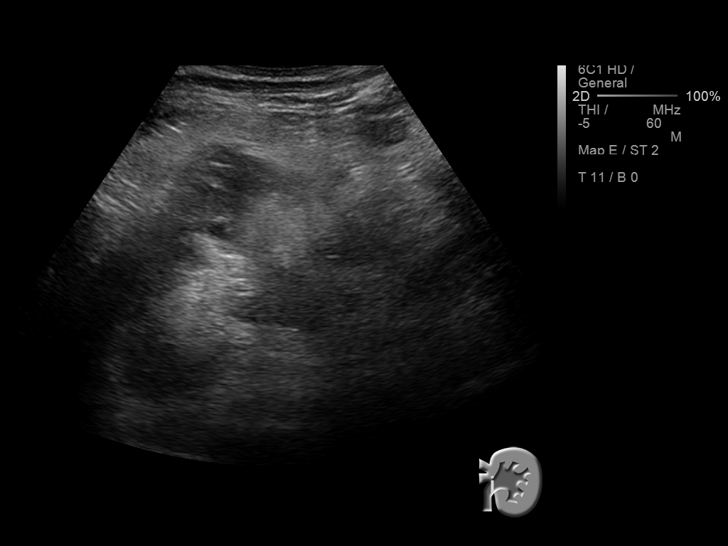
[im 34/75]
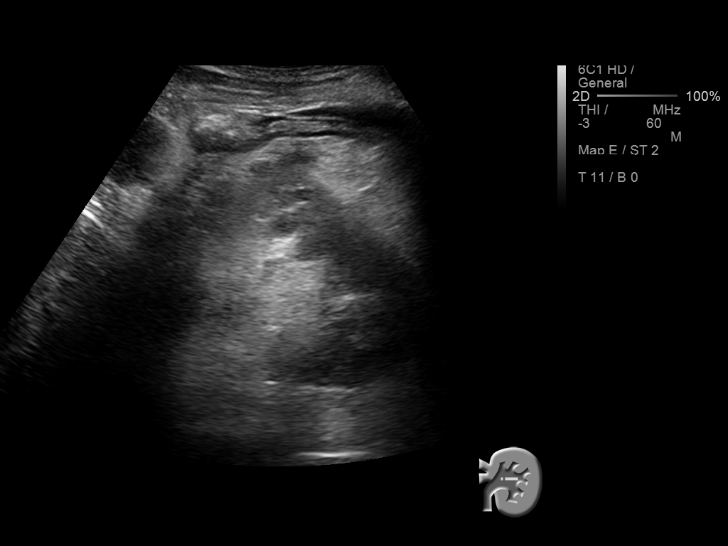
[im 41/75]
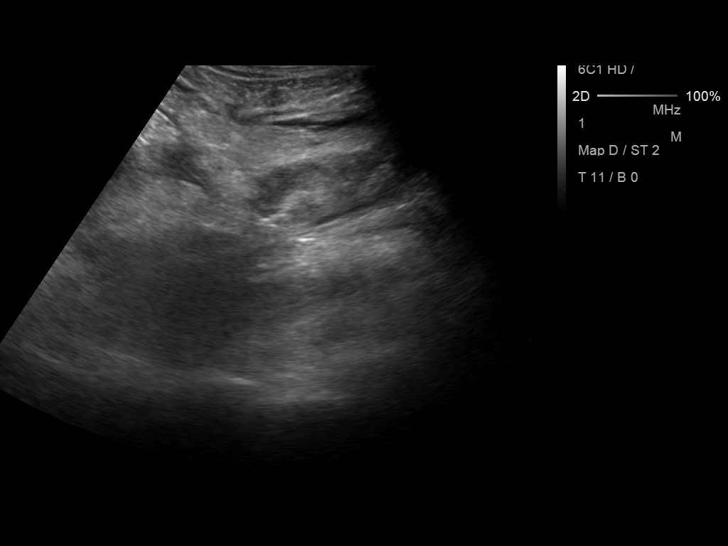
[im 47/75]
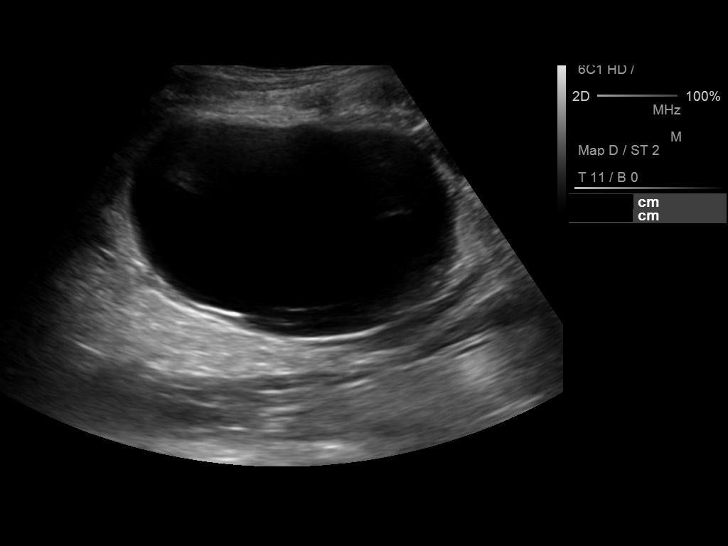
[im 50/75]
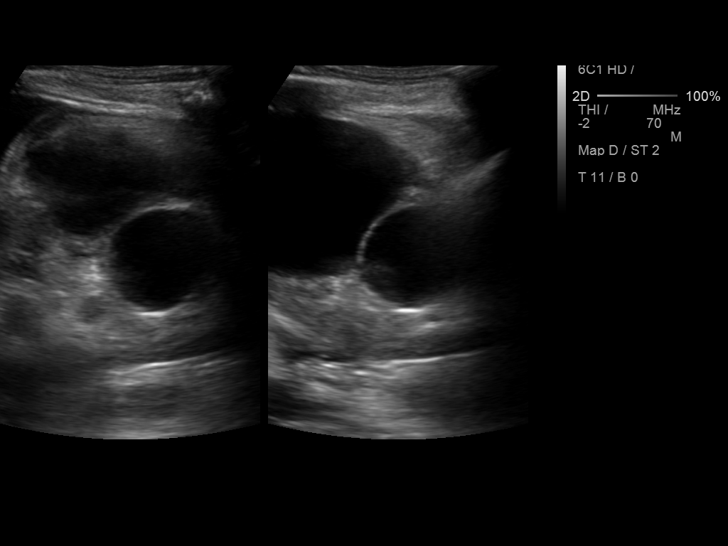
[im 56/75]
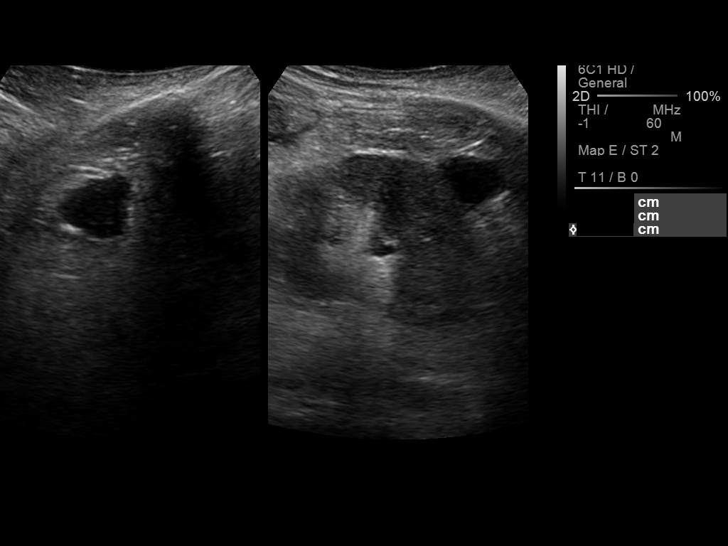
[im 62/75]
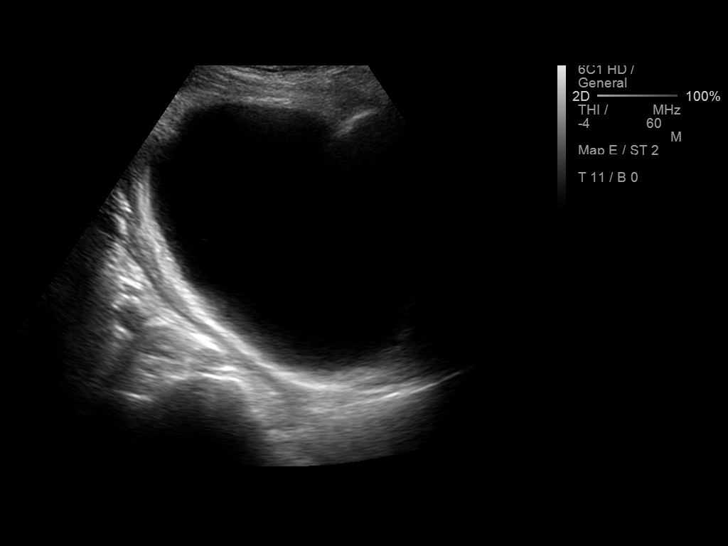
[im 68/75]
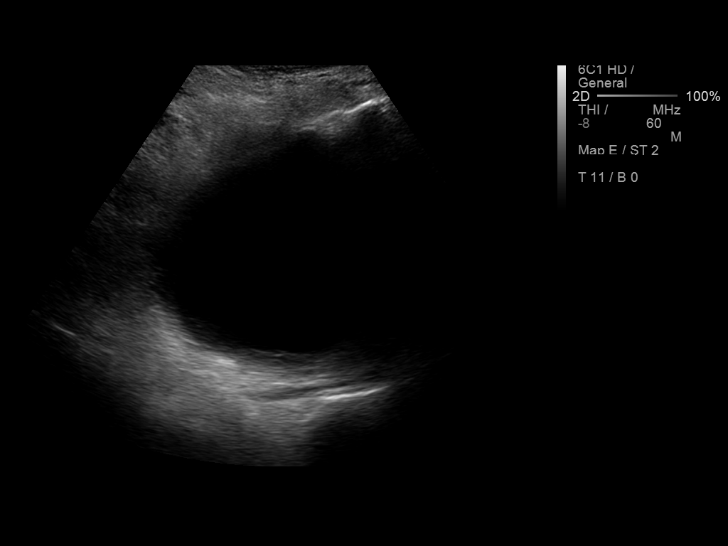
[im 75/75]
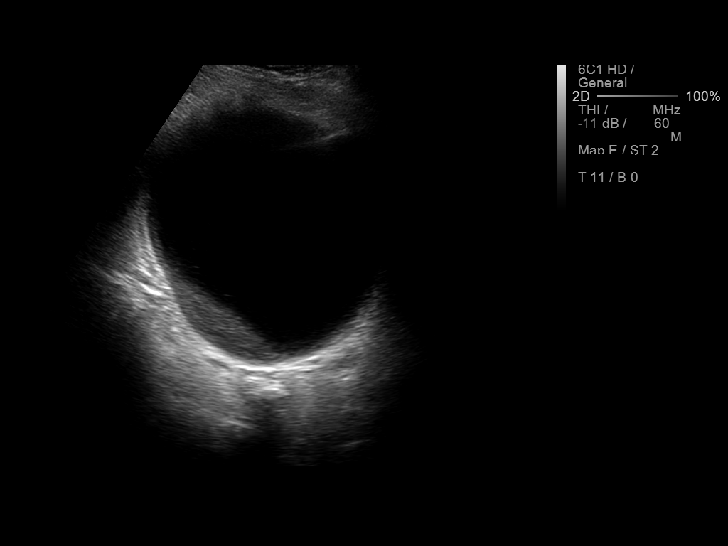

[14 of 25 positions shown; findings below may reference images not displayed]

FINDINGS: Right Kidney:

Length: At least 12.4 cm in length.. Multiple cysts are identified.
Largest cyst on the right is 9.9 x 6.5 x 9.3 cm. Upper pole cyst is
2.2 x 2.7 x 2.3 cm. Lower pole cyst is 3.6 x 3.4 x 3.7 cm

Left Kidney:

Length: At least 11.8 cm. Lower pole cyst is 2.2 x 1.8 x 1.8 cm.
There is mild left-sided hydronephrosis.

Bladder:

Echogenic debris identified within the dependent aspect of the
bladder. Bladder volume is estimated to be 1038.5 cc. Despite the
presence of a Foley catheter, catheter is not visible by ultrasound
and may be within the urethra.
IMPRESSION: 1. Multiple bilateral renal cysts. No suspicious renal lesions
identified.
2. Mild left-sided hydronephrosis.
3. Distended urinary bladder containing debris. Suspect that the
Foley catheter is not adequately positioned to drain the bladder.
Foley catheter is not visible on ultrasound evaluation of the
bladder.
The salient findings were discussed with Dr. Lehner on 06/15/2014 at
